# Patient Record
Sex: Male | Born: 1985 | Race: White | Hispanic: No | Marital: Married | Smoking: Current every day smoker
Health system: Southern US, Community
[De-identification: ages and names within clinical notes are randomized; demographics above are authoritative.]

## PROBLEM LIST (undated history)

## (undated) DIAGNOSIS — F909 Attention-deficit hyperactivity disorder, unspecified type: Secondary | ICD-10-CM

## (undated) DIAGNOSIS — F4310 Post-traumatic stress disorder, unspecified: Secondary | ICD-10-CM

## (undated) DIAGNOSIS — F419 Anxiety disorder, unspecified: Secondary | ICD-10-CM

## (undated) DIAGNOSIS — F319 Bipolar disorder, unspecified: Secondary | ICD-10-CM

## (undated) HISTORY — PX: WRIST SURGERY: SHX841

---
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
## 2004-07-15 ENCOUNTER — Ambulatory Visit: Payer: Self-pay | Admitting: Internal Medicine

## 2006-03-14 ENCOUNTER — Emergency Department (HOSPITAL_COMMUNITY): Admission: EM | Admit: 2006-03-14 | Discharge: 2006-03-14 | Payer: Self-pay | Admitting: Emergency Medicine

## 2006-05-10 ENCOUNTER — Emergency Department (HOSPITAL_COMMUNITY): Admission: EM | Admit: 2006-05-10 | Discharge: 2006-05-10 | Payer: Self-pay | Admitting: Family Medicine

## 2006-08-02 ENCOUNTER — Emergency Department (HOSPITAL_COMMUNITY): Admission: EM | Admit: 2006-08-02 | Discharge: 2006-08-02 | Payer: Self-pay | Admitting: Emergency Medicine

## 2008-08-11 ENCOUNTER — Emergency Department (HOSPITAL_COMMUNITY): Admission: EM | Admit: 2008-08-11 | Discharge: 2008-08-12 | Payer: Self-pay | Admitting: Emergency Medicine

## 2008-10-16 ENCOUNTER — Emergency Department: Payer: Self-pay | Admitting: Emergency Medicine

## 2008-11-05 ENCOUNTER — Emergency Department: Payer: Self-pay | Admitting: Emergency Medicine

## 2008-12-21 ENCOUNTER — Emergency Department (HOSPITAL_COMMUNITY): Admission: EM | Admit: 2008-12-21 | Discharge: 2008-12-21 | Payer: Self-pay | Admitting: Emergency Medicine

## 2009-01-24 ENCOUNTER — Encounter: Admission: RE | Admit: 2009-01-24 | Discharge: 2009-01-24 | Payer: Self-pay | Admitting: Sports Medicine

## 2009-04-24 ENCOUNTER — Emergency Department (HOSPITAL_COMMUNITY): Admission: EM | Admit: 2009-04-24 | Discharge: 2009-04-24 | Payer: Self-pay | Admitting: Emergency Medicine

## 2009-08-19 ENCOUNTER — Emergency Department: Payer: Self-pay | Admitting: Emergency Medicine

## 2009-11-26 ENCOUNTER — Emergency Department (HOSPITAL_COMMUNITY): Admission: EM | Admit: 2009-11-26 | Discharge: 2009-11-26 | Payer: Self-pay | Admitting: Emergency Medicine

## 2010-03-05 ENCOUNTER — Emergency Department (HOSPITAL_COMMUNITY)
Admission: EM | Admit: 2010-03-05 | Discharge: 2010-03-05 | Payer: Self-pay | Source: Home / Self Care | Admitting: Emergency Medicine

## 2010-05-18 ENCOUNTER — Emergency Department (HOSPITAL_COMMUNITY)
Admission: EM | Admit: 2010-05-18 | Discharge: 2010-05-18 | Disposition: A | Discharge status: Home / Self Care | Payer: Medicaid Other | Attending: Emergency Medicine | Admitting: Emergency Medicine

## 2010-05-18 DIAGNOSIS — M542 Cervicalgia: Secondary | ICD-10-CM | POA: Insufficient documentation

## 2010-05-18 DIAGNOSIS — F319 Bipolar disorder, unspecified: Secondary | ICD-10-CM | POA: Insufficient documentation

## 2010-05-18 DIAGNOSIS — I10 Essential (primary) hypertension: Secondary | ICD-10-CM | POA: Insufficient documentation

## 2010-05-18 DIAGNOSIS — X500XXA Overexertion from strenuous movement or load, initial encounter: Secondary | ICD-10-CM | POA: Insufficient documentation

## 2010-05-18 DIAGNOSIS — M545 Low back pain, unspecified: Secondary | ICD-10-CM | POA: Insufficient documentation

## 2010-05-18 DIAGNOSIS — M2560 Stiffness of unspecified joint, not elsewhere classified: Secondary | ICD-10-CM | POA: Insufficient documentation

## 2010-05-18 DIAGNOSIS — Y929 Unspecified place or not applicable: Secondary | ICD-10-CM | POA: Insufficient documentation

## 2010-05-18 DIAGNOSIS — Z79899 Other long term (current) drug therapy: Secondary | ICD-10-CM | POA: Insufficient documentation

## 2010-05-18 DIAGNOSIS — F988 Other specified behavioral and emotional disorders with onset usually occurring in childhood and adolescence: Secondary | ICD-10-CM | POA: Insufficient documentation

## 2010-05-18 DIAGNOSIS — M5380 Other specified dorsopathies, site unspecified: Secondary | ICD-10-CM | POA: Insufficient documentation

## 2010-05-18 DIAGNOSIS — M546 Pain in thoracic spine: Secondary | ICD-10-CM | POA: Insufficient documentation

## 2010-05-18 DIAGNOSIS — S335XXA Sprain of ligaments of lumbar spine, initial encounter: Secondary | ICD-10-CM | POA: Insufficient documentation

## 2010-06-10 LAB — BASIC METABOLIC PANEL
BUN: 8 mg/dL (ref 6–23)
CO2: 28 mEq/L (ref 19–32)
Calcium: 9.5 mg/dL (ref 8.4–10.5)
Chloride: 104 mEq/L (ref 96–112)
Creatinine, Ser: 0.65 mg/dL (ref 0.4–1.5)
GFR calc non Af Amer: >60 mL/min (ref >60)
Glucose, Bld: 87 mg/dL (ref 70–99)
Sodium: 140 mEq/L (ref 135–145)

## 2010-06-10 LAB — DIFFERENTIAL
Basophils Absolute: 0 10*3/uL (ref 0.0–0.1)
Lymphs Abs: 3 10*3/uL (ref 0.7–4.0)
Monocytes Relative: 9 % (ref 3–12)
Neutro Abs: 4 10*3/uL (ref 1.7–7.7)

## 2010-06-10 LAB — CBC
HCT: 44.3 % (ref 39.0–52.0)
MCHC: 35 g/dL (ref 30.0–36.0)
MCV: 87.7 fL (ref 78.0–100.0)
RBC: 5.05 MIL/uL (ref 4.22–5.81)
WBC: 7.7 10*3/uL (ref 4.0–10.5)

## 2010-06-10 LAB — URINALYSIS, ROUTINE W REFLEX MICROSCOPIC
Bilirubin Urine: NEGATIVE
Ketones, ur: NEGATIVE mg/dL
Nitrite: NEGATIVE
Protein, ur: NEGATIVE mg/dL
pH: 6.5 (ref 5.0–8.0)

## 2010-07-04 LAB — URINALYSIS, ROUTINE W REFLEX MICROSCOPIC
Glucose, UA: NEGATIVE mg/dL
Hgb urine dipstick: NEGATIVE
Nitrite: NEGATIVE
Protein, ur: NEGATIVE mg/dL
pH: 6 (ref 5.0–8.0)
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
## 2010-12-07 ENCOUNTER — Emergency Department (HOSPITAL_COMMUNITY)
Admission: EM | Admit: 2010-12-07 | Discharge: 2010-12-08 | Disposition: A | Discharge status: Home / Self Care | Payer: Medicaid Other | Attending: Emergency Medicine | Admitting: Emergency Medicine

## 2010-12-07 DIAGNOSIS — F319 Bipolar disorder, unspecified: Secondary | ICD-10-CM | POA: Insufficient documentation

## 2010-12-07 DIAGNOSIS — R51 Headache: Secondary | ICD-10-CM | POA: Insufficient documentation

## 2010-12-07 DIAGNOSIS — R6884 Jaw pain: Secondary | ICD-10-CM | POA: Insufficient documentation

## 2010-12-07 DIAGNOSIS — F988 Other specified behavioral and emotional disorders with onset usually occurring in childhood and adolescence: Secondary | ICD-10-CM | POA: Insufficient documentation

## 2010-12-07 DIAGNOSIS — M26609 Unspecified temporomandibular joint disorder, unspecified side: Secondary | ICD-10-CM | POA: Insufficient documentation

## 2010-12-16 ENCOUNTER — Emergency Department (HOSPITAL_COMMUNITY)
Admission: EM | Admit: 2010-12-16 | Discharge: 2010-12-16 | Disposition: A | Discharge status: Home / Self Care | Payer: Medicaid Other | Attending: Emergency Medicine | Admitting: Emergency Medicine

## 2010-12-16 DIAGNOSIS — T398X5A Adverse effect of other nonopioid analgesics and antipyretics, not elsewhere classified, initial encounter: Secondary | ICD-10-CM | POA: Insufficient documentation

## 2010-12-16 DIAGNOSIS — M545 Low back pain, unspecified: Secondary | ICD-10-CM | POA: Insufficient documentation

## 2010-12-16 DIAGNOSIS — R110 Nausea: Secondary | ICD-10-CM | POA: Insufficient documentation

## 2010-12-16 DIAGNOSIS — R6883 Chills (without fever): Secondary | ICD-10-CM | POA: Insufficient documentation

## 2010-12-16 DIAGNOSIS — IMO0001 Reserved for inherently not codable concepts without codable children: Secondary | ICD-10-CM | POA: Insufficient documentation

## 2010-12-16 DIAGNOSIS — R42 Dizziness and giddiness: Secondary | ICD-10-CM | POA: Insufficient documentation

## 2010-12-16 DIAGNOSIS — R5381 Other malaise: Secondary | ICD-10-CM | POA: Insufficient documentation

## 2010-12-16 DIAGNOSIS — R5383 Other fatigue: Secondary | ICD-10-CM | POA: Insufficient documentation

## 2010-12-16 LAB — ACETAMINOPHEN LEVEL: Acetaminophen (Tylenol), Serum: <15 ug/mL (ref 10–30)

## 2010-12-16 LAB — POCT I-STAT, CHEM 8
BUN: 7 mg/dL (ref 6–23)
Chloride: 102 mEq/L (ref 96–112)
Creatinine, Ser: 0.7 mg/dL (ref 0.50–1.35)
Glucose, Bld: 96 mg/dL (ref 70–99)
Hemoglobin: 16 g/dL (ref 13.0–17.0)

## 2010-12-16 LAB — ETHANOL: Alcohol, Ethyl (B): <11 mg/dL (ref 0–11)

## 2011-02-03 ENCOUNTER — Emergency Department (HOSPITAL_COMMUNITY): Payer: Medicaid Other

## 2011-02-03 ENCOUNTER — Emergency Department (HOSPITAL_COMMUNITY)
Admission: EM | Admit: 2011-02-03 | Discharge: 2011-02-03 | Disposition: A | Discharge status: Home / Self Care | Payer: Medicaid Other | Attending: Emergency Medicine | Admitting: Emergency Medicine

## 2011-02-03 ENCOUNTER — Encounter: Payer: Self-pay | Admitting: Student

## 2011-02-03 DIAGNOSIS — R05 Cough: Secondary | ICD-10-CM | POA: Insufficient documentation

## 2011-02-03 DIAGNOSIS — J40 Bronchitis, not specified as acute or chronic: Secondary | ICD-10-CM

## 2011-02-03 DIAGNOSIS — R5381 Other malaise: Secondary | ICD-10-CM | POA: Insufficient documentation

## 2011-02-03 DIAGNOSIS — R079 Chest pain, unspecified: Secondary | ICD-10-CM | POA: Insufficient documentation

## 2011-02-03 DIAGNOSIS — R059 Cough, unspecified: Secondary | ICD-10-CM | POA: Insufficient documentation

## 2011-02-03 MED ORDER — ALBUTEROL SULFATE HFA 108 (90 BASE) MCG/ACT IN AERS
2.0000 | INHALATION_SPRAY | Freq: Once | RESPIRATORY_TRACT | Status: AC
  Administered 2011-02-03: 2 via RESPIRATORY_TRACT
  Filled 2011-02-03: qty 6.7

## 2011-02-03 MED ORDER — PREDNISONE (PAK) 10 MG PO TABS: ORAL_TABLET | ORAL | Status: AC

## 2011-02-03 NOTE — ED Notes (Signed)
Pt in with c/o cough, wheezing, chest congestion, nasal congestion and pressure x 3 days. Report chest pains increase when coughing.

## 2011-02-03 NOTE — ED Provider Notes (Signed)
History     CSN: [id number] Arrival date & time: 02/03/2011  5:35 PM   First MD Initiated Contact with Patient 02/03/11 1938    Patient is a 25 y.o. male presenting with URI.  URI The primary symptoms include fatigue, sore throat, cough and wheezing. Primary symptoms do not include fever, headaches, ear pain, swollen glands, abdominal pain, nausea, vomiting, myalgias, arthralgias or rash. The current episode started 2 days ago. This is a new problem. The problem has been gradually worsening.   patient states a URI symptoms for 2 days. Reports symptoms seem to be worsening. States now is having pain while coughing in his chest. He denies trying anything over-the-counter. Denies shortness of breath, fever, abdominal pain, nausea, vomiting, headaches, neck pain, positive sick contacts  History reviewed. No pertinent past medical history.  Past Surgical History  Procedure Date  . Wrist surgery     History reviewed. No pertinent family history.  History  Substance Use Topics  . Smoking status: Current Everyday Smoker  . Smokeless tobacco: Not on file  . Alcohol Use: No      Review of Systems  Constitutional: Positive for fatigue. Negative for fever.  HENT: Positive for sore throat. Negative for ear pain, neck pain and neck stiffness.   Respiratory: Positive for cough and wheezing.   Gastrointestinal: Negative for nausea, vomiting and abdominal pain.  Musculoskeletal: Negative for myalgias and arthralgias.  Skin: Negative for rash.  Neurological: Negative for weakness, numbness and headaches.    Allergies  Review of patient's allergies indicates no known allergies.  Home Medications  No current outpatient prescriptions on file.  BP 131/72  Pulse 80  Temp 98.6 F (37 C)  Resp 20  Wt 190 lb (86.183 kg)  SpO2 100%  Physical Exam  Constitutional: He is oriented to person, place, and time. He appears well-developed and well-nourished.  HENT:  Head: Normocephalic and  atraumatic.  Right Ear: Tympanic membrane, external ear and ear canal normal.  Left Ear: Tympanic membrane, external ear and ear canal normal.  Nose: Nose normal.  Mouth/Throat: Uvula is midline, oropharynx is clear and moist and mucous membranes are normal.  Eyes: Conjunctivae are normal. Pupils are equal, round, and reactive to light.  Neck: Trachea normal, normal range of motion and full passive range of motion without pain. Neck supple. No spinous process tenderness and no muscular tenderness present. No Brudzinski's sign and no Kernig's sign noted.  Cardiovascular: Normal rate, regular rhythm and normal heart sounds.   Pulmonary/Chest: Effort normal and breath sounds normal.  Abdominal: Soft. Bowel sounds are normal.  Neurological: He is alert and oriented to person, place, and time.  Skin: Skin is warm and dry. No rash noted. No erythema. No pallor.  Psychiatric: He has a normal mood and affect. His behavior is normal.    ED Course  Procedures Dg Chest 2 View  02/03/2011  *RADIOLOGY REPORT*  Clinical Data: Cough and Shortness of breath.  CHEST - 2 VIEW  Comparison: 04/24/2009.  Findings: Peribronchial thickening may represent reactive changes or mild bronchitis.  No segmental infiltrate.  Probable prominent nipple shadow on the right.  This can be confirmed with nipple marker view.  Heart size within normal limits.  IMPRESSION: Peribronchial thickening.  Probable prominent nipple shadow.  Please see above.  Original Report Authenticated By: Fuller Canada, M.D.   Will treat patient for bronchitis with albuterol inhaler and pred pack. Advised Tylenol and ibuprofen for pain if needed. MDM  Thomasene Lot, [place] 02/03/11 2007

## 2011-02-03 NOTE — ED Provider Notes (Signed)
Medical screening examination/treatment/procedure(s) were performed by non-physician practitioner and as supervising physician I was immediately available for consultation/collaboration.  Ethelda Chick, MD 02/03/11 2008

## 2011-05-10 ENCOUNTER — Encounter (HOSPITAL_COMMUNITY): Payer: Self-pay

## 2011-05-10 ENCOUNTER — Emergency Department (HOSPITAL_COMMUNITY): Payer: Medicaid Other

## 2011-05-10 ENCOUNTER — Emergency Department (HOSPITAL_COMMUNITY)
Admission: EM | Admit: 2011-05-10 | Discharge: 2011-05-11 | Disposition: A | Discharge status: Home / Self Care | Payer: Medicaid Other | Attending: Emergency Medicine | Admitting: Emergency Medicine

## 2011-05-10 DIAGNOSIS — R10819 Abdominal tenderness, unspecified site: Secondary | ICD-10-CM | POA: Insufficient documentation

## 2011-05-10 DIAGNOSIS — M549 Dorsalgia, unspecified: Secondary | ICD-10-CM | POA: Insufficient documentation

## 2011-05-10 DIAGNOSIS — G8929 Other chronic pain: Secondary | ICD-10-CM | POA: Insufficient documentation

## 2011-05-10 DIAGNOSIS — R109 Unspecified abdominal pain: Secondary | ICD-10-CM | POA: Insufficient documentation

## 2011-05-10 LAB — CBC
HCT: 44.3 % (ref 39.0–52.0)
Hemoglobin: 15.8 g/dL (ref 13.0–17.0)
MCV: 87.5 fL (ref 78.0–100.0)
Platelets: 288 10*3/uL (ref 150–400)
RBC: 5.06 MIL/uL (ref 4.22–5.81)
WBC: 9.3 10*3/uL (ref 4.0–10.5)

## 2011-05-10 LAB — DIFFERENTIAL
Basophils Absolute: 0 10*3/uL (ref 0.0–0.1)
Basophils Relative: 0 % (ref 0–1)
Eosinophils Absolute: 0.1 10*3/uL (ref 0.0–0.7)
Eosinophils Relative: 1 % (ref 0–5)
Lymphocytes Relative: 35 % (ref 12–46)
Lymphs Abs: 3.2 10*3/uL (ref 0.7–4.0)
Neutro Abs: 5.4 10*3/uL (ref 1.7–7.7)
Neutrophils Relative %: 58 % (ref 43–77)

## 2011-05-10 LAB — COMPREHENSIVE METABOLIC PANEL
ALT: 21 U/L (ref 0–53)
AST: 27 U/L (ref 0–37)
Albumin: 4.5 g/dL (ref 3.5–5.2)
CO2: 24 mEq/L (ref 19–32)
Creatinine, Ser: 0.6 mg/dL (ref 0.50–1.35)
Potassium: 3.7 mEq/L (ref 3.5–5.1)
Sodium: 138 mEq/L (ref 135–145)
Total Protein: 8.4 g/dL — ABNORMAL HIGH (ref 6.0–8.3)

## 2011-05-10 LAB — URINALYSIS, ROUTINE W REFLEX MICROSCOPIC
Bilirubin Urine: NEGATIVE
Hgb urine dipstick: NEGATIVE
Leukocytes, UA: NEGATIVE
Nitrite: NEGATIVE

## 2011-05-10 MED ORDER — HYDROCODONE-ACETAMINOPHEN 5-325 MG PO TABS: 2.0000 | ORAL_TABLET | ORAL | Status: AC | PRN

## 2011-05-10 MED ORDER — SODIUM CHLORIDE 0.9 % IV BOLUS (SEPSIS)
1000.0000 mL | Freq: Once | INTRAVENOUS | Status: AC
  Administered 2011-05-10: 1000 mL via INTRAVENOUS

## 2011-05-10 MED ORDER — IOHEXOL 300 MG/ML  SOLN
100.0000 mL | Freq: Once | INTRAMUSCULAR | Status: AC | PRN
  Administered 2011-05-10: 100 mL via INTRAVENOUS

## 2011-05-10 MED ORDER — METOCLOPRAMIDE HCL 10 MG PO TABS: 10.0000 mg | ORAL_TABLET | Freq: Four times a day (QID) | ORAL | Status: DC | PRN

## 2011-05-10 MED ORDER — ONDANSETRON HCL 4 MG/2ML IJ SOLN
4.0000 mg | Freq: Once | INTRAMUSCULAR | Status: AC
  Administered 2011-05-10: 4 mg via INTRAVENOUS
  Filled 2011-05-10: qty 2

## 2011-05-10 MED ORDER — MORPHINE SULFATE 4 MG/ML IJ SOLN
4.0000 mg | Freq: Once | INTRAMUSCULAR | Status: AC
  Administered 2011-05-10: 4 mg via INTRAVENOUS
  Filled 2011-05-10: qty 1

## 2011-05-10 NOTE — ED Provider Notes (Signed)
History     CSN: [id number]  Arrival date & time 05/10/11  1904   First MD Initiated Contact with Patient 05/10/11 2135      Chief Complaint  Patient presents with  . Abdominal Pain     Patient is a 26 y.o. male presenting with abdominal pain.  Abdominal Pain The primary symptoms of the illness include abdominal pain and diarrhea. The primary symptoms of the illness do not include fever, shortness of breath, nausea, vomiting or dysuria.  Additional symptoms associated with the illness include back pain. Symptoms associated with the illness do not include chills, constipation, hematuria or frequency.    History provided by the patient. Patient is a 26 year old male with no significant past medical history presents with complaints of lower abdominal pains and irregular bowel movements. Patient states symptoms have been going on longer than a year. Patient has not been evaluated for these symptoms previously. Symptoms are made worse after eating and are associated with loose diarrhea-like stools after eating. Patient denies having blood or mucus in stools. Symptoms are described as moderate to severe at times. Pains are sharp and cramping. Patient also has second complaint of worsening chronic low back pains. Patient states he he has been feeling with low back pains for many many years and was formally seen by specialist. Since that time he reports increased episodes of low back soreness and pain. Pains and back are worse with moving and walking. They're sometimes improved with leaning forward. He denies any urinary or fecal incontinence, urinary retention, weakness or numbness in extremities. Patient denies any other aggravating or alleviating factors for his symptoms patient denies any fever, chills, sweats, nausea or vomiting. Patient reports having a brother with history of IBS and mother with history of multiple back surgeries.    History reviewed. No pertinent past medical history.  Past  Surgical History  Procedure Date  . Wrist surgery     History reviewed. No pertinent family history.  History  Substance Use Topics  . Smoking status: Current Everyday Smoker  . Smokeless tobacco: Not on file  . Alcohol Use: No      Review of Systems  Constitutional: Negative for fever and chills.  Respiratory: Negative for cough and shortness of breath.   Cardiovascular: Negative for chest pain.  Gastrointestinal: Positive for abdominal pain and diarrhea. Negative for nausea, vomiting, constipation and blood in stool.  Genitourinary: Negative for dysuria, frequency, hematuria and flank pain.  Musculoskeletal: Positive for back pain.  All other systems reviewed and are negative.    Allergies  Review of patient's allergies indicates no known allergies.  Home Medications  No current outpatient prescriptions on file.  BP 152/80  Pulse 67  Temp(Src) 99.1 F (37.3 C) (Oral)  Resp 18  SpO2 100%  Physical Exam  Nursing note and vitals reviewed. Constitutional: He appears well-developed and well-nourished.  HENT:  Head: Normocephalic.  Cardiovascular: Normal rate and regular rhythm.   Pulmonary/Chest: Effort normal and breath sounds normal.  Abdominal: Soft. There is tenderness in the right lower quadrant, suprapubic area and left lower quadrant. There is no rigidity, no rebound, no guarding, no CVA tenderness, no tenderness at McBurney's point and negative Murphy's sign.  Musculoskeletal:       Cervical back: Normal.       Thoracic back: He exhibits tenderness.       Lumbar back: He exhibits tenderness.       Back:  Neurological: He has normal strength. No cranial nerve deficit  or sensory deficit. Gait normal.    ED Course  Procedures    Labs Reviewed  URINALYSIS, ROUTINE W REFLEX MICROSCOPIC  CBC  DIFFERENTIAL  COMPREHENSIVE METABOLIC PANEL   Results for orders placed during the hospital encounter of 05/10/11  URINALYSIS, ROUTINE W REFLEX MICROSCOPIC       Component Value Range   Color, Urine YELLOW  YELLOW    APPearance CLEAR  CLEAR    Specific Gravity, Urine 1.014  1.005 - 1.030    pH 7.0  5.0 - 8.0    Glucose, UA NEGATIVE  NEGATIVE (mg/dL)   Hgb urine dipstick NEGATIVE  NEGATIVE    Bilirubin Urine NEGATIVE  NEGATIVE    Ketones, ur NEGATIVE  NEGATIVE (mg/dL)   Protein, ur NEGATIVE  NEGATIVE (mg/dL)   Urobilinogen, UA 1.0  0.0 - 1.0 (mg/dL)   Nitrite NEGATIVE  NEGATIVE    Leukocytes, UA NEGATIVE  NEGATIVE   CBC      Component Value Range   WBC 9.3  4.0 - 10.5 (K/uL)   RBC 5.06  4.22 - 5.81 (MIL/uL)   Hemoglobin 15.8  13.0 - 17.0 (g/dL)   HCT 16.1  09.6 - 04.5 (%)   MCV 87.5  78.0 - 100.0 (fL)   MCH 31.2  26.0 - 34.0 (pg)   MCHC 35.7  30.0 - 36.0 (g/dL)   RDW 40.9  81.1 - 91.4 (%)   Platelets 288  150 - 400 (K/uL)  DIFFERENTIAL      Component Value Range   Neutrophils Relative 58  43 - 77 (%)   Neutro Abs 5.4  1.7 - 7.7 (K/uL)   Lymphocytes Relative 35  12 - 46 (%)   Lymphs Abs 3.2  0.7 - 4.0 (K/uL)   Monocytes Relative 6  3 - 12 (%)   Monocytes Absolute 0.6  0.1 - 1.0 (K/uL)   Eosinophils Relative 1  0 - 5 (%)   Eosinophils Absolute 0.1  0.0 - 0.7 (K/uL)   Basophils Relative 0  0 - 1 (%)   Basophils Absolute 0.0  0.0 - 0.1 (K/uL)  COMPREHENSIVE METABOLIC PANEL      Component Value Range   Sodium 138  135 - 145 (mEq/L)   Potassium 3.7  3.5 - 5.1 (mEq/L)   Chloride 102  96 - 112 (mEq/L)   CO2 24  19 - 32 (mEq/L)   Glucose, Bld 89  70 - 99 (mg/dL)   BUN 11  6 - 23 (mg/dL)   Creatinine, Ser 7.82  0.50 - 1.35 (mg/dL)   Calcium 95.6  8.4 - 10.5 (mg/dL)   Total Protein 8.4 (*) 6.0 - 8.3 (g/dL)   Albumin 4.5  3.5 - 5.2 (g/dL)   AST 27  0 - 37 (U/L)   ALT 21  0 - 53 (U/L)   Alkaline Phosphatase 71  39 - 117 (U/L)   Total Bilirubin 0.3  0.3 - 1.2 (mg/dL)   GFR calc non Af Amer >90  >90 (mL/min)   GFR calc Af Amer >90  >90 (mL/min)      Ct Abdomen Pelvis W Contrast  05/10/2011  *RADIOLOGY REPORT*  Clinical Data: Right  lower quadrant pain and nausea  CT ABDOMEN AND PELVIS WITH CONTRAST  Technique:  Multidetector CT imaging of the abdomen and pelvis was performed following the standard protocol during bolus administration of intravenous contrast.  Contrast: OMNIPAQUE IOHEXOL 300 MG/ML IV SOLN  Comparison: None.  Findings: The lung bases are clear.  The  liver, spleen, gallbladder, pancreas, adrenal glands, kidneys, abdominal aorta, and retroperitoneal lymph nodes are unremarkable.  The stomach is filled with contrast and fluid without wall thickening.  Proximal jejunum demonstrates mild dilatation with contrast filled loops. Distal small bowel is decompressed.  The transition zone is in the upper mid abdomen.  No focal mass lesions are demonstrated.  This may represent early partial obstruction.  Stool filled colon without distension.  No free air or free fluid in the abdomen.  Pelvis:  The bladder wall is not thickened.  Prostate gland is not enlarged.  No free or loculated pelvic fluid collections.  No inflammatory changes in the sigmoid colon.  The appendix is not visualized but no inflammatory changes are demonstrated in the right lower quadrant.  Normal alignment of the lumbar vertebrae.  IMPRESSION: Borderline dilatation of proximal jejunum with transition zone in the upper mid abdomen and more decompressed distal loops.  Changes could represent early partial obstruction.  No obstructing mass is visualized and changes may represent adhesions.  No focal inflammatory process demonstrated.  Original Report Authenticated By: Marlon Pel, M.D.     1. Abdominal pain   2. Chronic back pain       MDM  9:30 PM patient seen and evaluated. In no acute distress. Symptoms are chronic but worsening.  Patient having some improvement of pain symptoms. Lab tests are unremarkable today. CAT scan also without any signs for emergent condition. There is some signs for possible slowing of bowels the patient has been  without any symptoms of nausea and vomiting. Patient also has no history of surgery on abdomen. This time patient fell able to return home.      Angus Seller, PA 05/11/11 0002

## 2011-05-10 NOTE — ED Notes (Signed)
Pt complains of lower abd pain for one month that acutely happens everytime he eats, he also complains of lower back pain

## 2011-05-11 NOTE — ED Provider Notes (Signed)
Medical screening examination/treatment/procedure(s) were performed by non-physician practitioner and as supervising physician I was immediately available for consultation/collaboration.    Nelia Shi, MD 05/11/11 2248

## 2011-06-17 ENCOUNTER — Emergency Department (HOSPITAL_COMMUNITY): Payer: Medicaid Other

## 2011-06-17 ENCOUNTER — Emergency Department (HOSPITAL_COMMUNITY)
Admission: EM | Admit: 2011-06-17 | Discharge: 2011-06-17 | Disposition: A | Discharge status: Home / Self Care | Payer: Medicaid Other | Attending: Emergency Medicine | Admitting: Emergency Medicine

## 2011-06-17 DIAGNOSIS — M791 Myalgia, unspecified site: Secondary | ICD-10-CM

## 2011-06-17 DIAGNOSIS — IMO0001 Reserved for inherently not codable concepts without codable children: Secondary | ICD-10-CM | POA: Insufficient documentation

## 2011-06-17 DIAGNOSIS — F17200 Nicotine dependence, unspecified, uncomplicated: Secondary | ICD-10-CM | POA: Insufficient documentation

## 2011-06-17 MED ORDER — IBUPROFEN 800 MG PO TABS
800.0000 mg | ORAL_TABLET | Freq: Once | ORAL | Status: AC
  Administered 2011-06-17: 800 mg via ORAL
  Filled 2011-06-17: qty 1

## 2011-06-17 NOTE — ED Provider Notes (Signed)
Medical screening examination/treatment/procedure(s) were performed by non-physician practitioner and as supervising physician I was immediately available for consultation/collaboration.  Cheri Guppy, MD 06/17/11 [phone]

## 2011-06-17 NOTE — Discharge Instructions (Signed)
Jesus Bauer you have muscle pain today.  Try using ice and heat intermittantly.  Take ibuprofen 600mg  every 6 hours x 24 with food.  Get a pcp from the list to follow up with or return if fever, uncontrolled nausea and vomiting.    Cryotherapy Cryotherapy means treatment with cold. Ice or gel packs can be used to reduce both pain and swelling. Ice is the most helpful within the first 24 to 48 hours after an injury or flareup from overusing a muscle or joint. Sprains, strains, spasms, burning pain, shooting pain, and aches can all be eased with ice. Ice can also be used when recovering from surgery. Ice is effective, has very few side effects, and is safe for most people to use. PRECAUTIONS  Ice is not a safe treatment option for people with:  Raynaud's phenomenon. This is a condition affecting small blood vessels in the extremities. Exposure to cold may cause your problems to return.   Cold hypersensitivity. There are many forms of cold hypersensitivity, including:   Cold urticaria. Red, itchy hives appear on the skin when the tissues begin to warm after being iced.   Cold erythema. This is a red, itchy rash caused by exposure to cold.   Cold hemoglobinuria. Red blood cells break down when the tissues begin to warm after being iced. The hemoglobin that carry oxygen are passed into the urine because they cannot combine with blood proteins fast enough.   Numbness or altered sensitivity in the area being iced.  If you have any of the following conditions, do not use ice until you have discussed cryotherapy with your caregiver:  Heart conditions, such as arrhythmia, angina, or chronic heart disease.   High blood pressure.   Healing wounds or open skin in the area being iced.   Current infections.   Rheumatoid arthritis.   Poor circulation.   Diabetes.  Ice slows the blood flow in the region it is applied. This is beneficial when trying to stop inflamed tissues from spreading irritating  chemicals to surrounding tissues. However, if you expose your skin to cold temperatures for too long or without the proper protection, you can damage your skin or nerves. Watch for signs of skin damage due to cold. HOME CARE INSTRUCTIONS Follow these tips to use ice and cold packs safely.  Place a dry or damp towel between the ice and skin. A damp towel will cool the skin more quickly, so you may need to shorten the time that the ice is used.   For a more rapid response, add gentle compression to the ice.   Ice for no more than 10 to 20 minutes at a time. The bonier the area you are icing, the less time it will take to get the benefits of ice.   Check your skin after 5 minutes to make sure there are no signs of a poor response to cold or skin damage.   Rest 20 minutes or more in between uses.   Once your skin is numb, you can end your treatment. You can test numbness by very lightly touching your skin. The touch should be so light that you do not see the skin dimple from the pressure of your fingertip. When using ice, most people will feel these normal sensations in this order: cold, burning, aching, and numbness.   Do not use ice on someone who cannot communicate their responses to pain, such as small children or people with dementia.  HOW TO MAKE AN  ICE PACK Ice packs are the most common way to use ice therapy. Other methods include ice massage, ice baths, and cryo-sprays. Muscle creams that cause a cold, tingly feeling do not offer the same benefits that ice offers and should not be used as a substitute unless recommended by your caregiver. To make an ice pack, do one of the following:  Place crushed ice or a bag of frozen vegetables in a sealable plastic bag. Squeeze out the excess air. Place this bag inside another plastic bag. Slide the bag into a pillowcase or place a damp towel between your skin and the bag.   Mix 3 parts water with 1 part rubbing alcohol. Freeze the mixture in a  sealable plastic bag. When you remove the mixture from the freezer, it will be slushy. Squeeze out the excess air. Place this bag inside another plastic bag. Slide the bag into a pillowcase or place a damp towel between your skin and the bag.  SEEK MEDICAL CARE IF:  You develop white spots on your skin. This may give the skin a blotchy (mottled) appearance.   Your skin turns blue or pale.   Your skin becomes waxy or hard.   Your swelling gets worse.  MAKE SURE YOU:   Understand these instructions.   Will watch your condition.   Will get help right away if you are not doing well or get worse.  Document Released: 11/10/2010 Document Revised: 03/05/2011 Document Reviewed: 11/10/2010 [facility] Patient Information 2012 [place], [place].Cryotherapy Cryotherapy means treatment with cold. Ice or gel packs can be used to reduce both pain and swelling. Ice is the most helpful within the first 24 to 48 hours after an injury or flareup from overusing a muscle or joint. Sprains, strains, spasms, burning pain, shooting pain, and aches can all be eased with ice. Ice can also be used when recovering from surgery. Ice is effective, has very few side effects, and is safe for most people to use. PRECAUTIONS  Ice is not a safe treatment option for people with:  Raynaud's phenomenon. This is a condition affecting small blood vessels in the extremities. Exposure to cold may cause your problems to return.   Cold hypersensitivity. There are many forms of cold hypersensitivity, including:   Cold urticaria. Red, itchy hives appear on the skin when the tissues begin to warm after being iced.   Cold erythema. This is a red, itchy rash caused by exposure to cold.   Cold hemoglobinuria. Red blood cells break down when the tissues begin to warm after being iced. The hemoglobin that carry oxygen are passed into the urine because they cannot combine with blood proteins fast enough.   Numbness or altered sensitivity in  the area being iced.  If you have any of the following conditions, do not use ice until you have discussed cryotherapy with your caregiver:  Heart conditions, such as arrhythmia, angina, or chronic heart disease.   High blood pressure.   Healing wounds or open skin in the area being iced.   Current infections.   Rheumatoid arthritis.   Poor circulation.   Diabetes.  Ice slows the blood flow in the region it is applied. This is beneficial when trying to stop inflamed tissues from spreading irritating chemicals to surrounding tissues. However, if you expose your skin to cold temperatures for too long or without the proper protection, you can damage your skin or nerves. Watch for signs of skin damage due to cold. HOME CARE INSTRUCTIONS Follow these  tips to use ice and cold packs safely.  Place a dry or damp towel between the ice and skin. A damp towel will cool the skin more quickly, so you may need to shorten the time that the ice is used.   For a more rapid response, add gentle compression to the ice.   Ice for no more than 10 to 20 minutes at a time. The bonier the area you are icing, the less time it will take to get the benefits of ice.   Check your skin after 5 minutes to make sure there are no signs of a poor response to cold or skin damage.   Rest 20 minutes or more in between uses.   Once your skin is numb, you can end your treatment. You can test numbness by very lightly touching your skin. The touch should be so light that you do not see the skin dimple from the pressure of your fingertip. When using ice, most people will feel these normal sensations in this order: cold, burning, aching, and numbness.   Do not use ice on someone who cannot communicate their responses to pain, such as small children or people with dementia.  HOW TO MAKE AN ICE PACK Ice packs are the most common way to use ice therapy. Other methods include ice massage, ice baths, and cryo-sprays. Muscle  creams that cause a cold, tingly feeling do not offer the same benefits that ice offers and should not be used as a substitute unless recommended by your caregiver. To make an ice pack, do one of the following:  Place crushed ice or a bag of frozen vegetables in a sealable plastic bag. Squeeze out the excess air. Place this bag inside another plastic bag. Slide the bag into a pillowcase or place a damp towel between your skin and the bag.   Mix 3 parts water with 1 part rubbing alcohol. Freeze the mixture in a sealable plastic bag. When you remove the mixture from the freezer, it will be slushy. Squeeze out the excess air. Place this bag inside another plastic bag. Slide the bag into a pillowcase or place a damp towel between your skin and the bag.  SEEK MEDICAL CARE IF:  You develop white spots on your skin. This may give the skin a blotchy (mottled) appearance.   Your skin turns blue or pale.   Your skin becomes waxy or hard.   Your swelling gets worse.  MAKE SURE YOU:   Understand these instructions.   Will watch your condition.   Will get help right away if you are not doing well or get worse.  Document Released: 11/10/2010 Document Revised: 03/05/2011 Document Reviewed: 11/10/2010 [facility] Patient Information 2012 [place], [place].Cryotherapy Cryotherapy means treatment with cold. Ice or gel packs can be used to reduce both pain and swelling. Ice is the most helpful within the first 24 to 48 hours after an injury or flareup from overusing a muscle or joint. Sprains, strains, spasms, burning pain, shooting pain, and aches can all be eased with ice. Ice can also be used when recovering from surgery. Ice is effective, has very few side effects, and is safe for most people to use. PRECAUTIONS  Ice is not a safe treatment option for people with:  Raynaud's phenomenon. This is a condition affecting small blood vessels in the extremities. Exposure to cold may cause your problems to return.     Cold hypersensitivity. There are many forms of cold hypersensitivity, including:  Cold urticaria. Red, itchy hives appear on the skin when the tissues begin to warm after being iced.   Cold erythema. This is a red, itchy rash caused by exposure to cold.   Cold hemoglobinuria. Red blood cells break down when the tissues begin to warm after being iced. The hemoglobin that carry oxygen are passed into the urine because they cannot combine with blood proteins fast enough.   Numbness or altered sensitivity in the area being iced.  If you have any of the following conditions, do not use ice until you have discussed cryotherapy with your caregiver:  Heart conditions, such as arrhythmia, angina, or chronic heart disease.   High blood pressure.   Healing wounds or open skin in the area being iced.   Current infections.   Rheumatoid arthritis.   Poor circulation.   Diabetes.  Ice slows the blood flow in the region it is applied. This is beneficial when trying to stop inflamed tissues from spreading irritating chemicals to surrounding tissues. However, if you expose your skin to cold temperatures for too long or without the proper protection, you can damage your skin or nerves. Watch for signs of skin damage due to cold. HOME CARE INSTRUCTIONS Follow these tips to use ice and cold packs safely.  Place a dry or damp towel between the ice and skin. A damp towel will cool the skin more quickly, so you may need to shorten the time that the ice is used.   For a more rapid response, add gentle compression to the ice.   Ice for no more than 10 to 20 minutes at a time. The bonier the area you are icing, the less time it will take to get the benefits of ice.   Check your skin after 5 minutes to make sure there are no signs of a poor response to cold or skin damage.   Rest 20 minutes or more in between uses.   Once your skin is numb, you can end your treatment. You can test numbness by very  lightly touching your skin. The touch should be so light that you do not see the skin dimple from the pressure of your fingertip. When using ice, most people will feel these normal sensations in this order: cold, burning, aching, and numbness.   Do not use ice on someone who cannot communicate their responses to pain, such as small children or people with dementia.  HOW TO MAKE AN ICE PACK Ice packs are the most common way to use ice therapy. Other methods include ice massage, ice baths, and cryo-sprays. Muscle creams that cause a cold, tingly feeling do not offer the same benefits that ice offers and should not be used as a substitute unless recommended by your caregiver. To make an ice pack, do one of the following:  Place crushed ice or a bag of frozen vegetables in a sealable plastic bag. Squeeze out the excess air. Place this bag inside another plastic bag. Slide the bag into a pillowcase or place a damp towel between your skin and the bag.   Mix 3 parts water with 1 part rubbing alcohol. Freeze the mixture in a sealable plastic bag. When you remove the mixture from the freezer, it will be slushy. Squeeze out the excess air. Place this bag inside another plastic bag. Slide the bag into a pillowcase or place a damp towel between your skin and the bag.  SEEK MEDICAL CARE IF:  You develop white spots on your  skin. This may give the skin a blotchy (mottled) appearance.   Your skin turns blue or pale.   Your skin becomes waxy or hard.   Your swelling gets worse.  MAKE SURE YOU:   Understand these instructions.   Will watch your condition.   Will get help right away if you are not doing well or get worse.  Document Released: 11/10/2010 Document Revised: 03/05/2011 Document Reviewed: 11/10/2010 [facility] Patient Information 2012 [place], [place].Cryotherapy Cryotherapy means treatment with cold. Ice or gel packs can be used to reduce both pain and swelling. Ice is the most helpful within the  first 24 to 48 hours after an injury or flareup from overusing a muscle or joint. Sprains, strains, spasms, burning pain, shooting pain, and aches can all be eased with ice. Ice can also be used when recovering from surgery. Ice is effective, has very few side effects, and is safe for most people to use. PRECAUTIONS  Ice is not a safe treatment option for people with:  Raynaud's phenomenon. This is a condition affecting small blood vessels in the extremities. Exposure to cold may cause your problems to return.   Cold hypersensitivity. There are many forms of cold hypersensitivity, including:   Cold urticaria. Red, itchy hives appear on the skin when the tissues begin to warm after being iced.   Cold erythema. This is a red, itchy rash caused by exposure to cold.   Cold hemoglobinuria. Red blood cells break down when the tissues begin to warm after being iced. The hemoglobin that carry oxygen are passed into the urine because they cannot combine with blood proteins fast enough.   Numbness or altered sensitivity in the area being iced.  If you have any of the following conditions, do not use ice until you have discussed cryotherapy with your caregiver:  Heart conditions, such as arrhythmia, angina, or chronic heart disease.   High blood pressure.   Healing wounds or open skin in the area being iced.   Current infections.   Rheumatoid arthritis.   Poor circulation.   Diabetes.  Ice slows the blood flow in the region it is applied. This is beneficial when trying to stop inflamed tissues from spreading irritating chemicals to surrounding tissues. However, if you expose your skin to cold temperatures for too long or without the proper protection, you can damage your skin or nerves. Watch for signs of skin damage due to cold. HOME CARE INSTRUCTIONS Follow these tips to use ice and cold packs safely.  Place a dry or damp towel between the ice and skin. A damp towel will cool the skin more  quickly, so you may need to shorten the time that the ice is used.   For a more rapid response, add gentle compression to the ice.   Ice for no more than 10 to 20 minutes at a time. The bonier the area you are icing, the less time it will take to get the benefits of ice.   Check your skin after 5 minutes to make sure there are no signs of a poor response to cold or skin damage.   Rest 20 minutes or more in between uses.   Once your skin is numb, you can end your treatment. You can test numbness by very lightly touching your skin. The touch should be so light that you do not see the skin dimple from the pressure of your fingertip. When using ice, most people will feel these normal sensations in this order: cold,  burning, aching, and numbness.   Do not use ice on someone who cannot communicate their responses to pain, such as small children or people with dementia.  HOW TO MAKE AN ICE PACK Ice packs are the most common way to use ice therapy. Other methods include ice massage, ice baths, and cryo-sprays. Muscle creams that cause a cold, tingly feeling do not offer the same benefits that ice offers and should not be used as a substitute unless recommended by your caregiver. To make an ice pack, do one of the following:  Place crushed ice or a bag of frozen vegetables in a sealable plastic bag. Squeeze out the excess air. Place this bag inside another plastic bag. Slide the bag into a pillowcase or place a damp towel between your skin and the bag.   Mix 3 parts water with 1 part rubbing alcohol. Freeze the mixture in a sealable plastic bag. When you remove the mixture from the freezer, it will be slushy. Squeeze out the excess air. Place this bag inside another plastic bag. Slide the bag into a pillowcase or place a damp towel between your skin and the bag.  SEEK MEDICAL CARE IF:  You develop white spots on your skin. This may give the skin a blotchy (mottled) appearance.   Your skin turns blue  or pale.   Your skin becomes waxy or hard.   Your swelling gets worse.  MAKE SURE YOU:   Understand these instructions.   Will watch your condition.   Will get help right away if you are not doing well or get worse.  Document Released: 11/10/2010 Document Revised: 03/05/2011 Document Reviewed: 11/10/2010 [facility] Patient Information 2012 [place], [place].

## 2011-06-17 NOTE — ED Notes (Signed)
Patient reports that he developed left hip pain while walking, reports pain with movement, denies trauma

## 2011-06-17 NOTE — ED Provider Notes (Signed)
History     CSN: [id number]  Arrival date & time 06/17/11  1256   None     Chief Complaint  Patient presents with  . Hip Pain    (Consider location/radiation/quality/duration/timing/severity/associated sxs/prior treatment) Patient is a 26 y.o. male presenting with hip pain. The history is provided by the patient. No language interpreter was used.  Hip Pain This is a new problem. The current episode started yesterday. The problem occurs every several days. Associated symptoms include arthralgias. Pertinent negatives include no chills, fever, joint swelling, nausea, numbness, swollen glands, vomiting or weakness. The symptoms are aggravated by nothing (laying down in bed). He has tried nothing for the symptoms. The treatment provided mild relief.   Reports R Hip pain after walking 1 mile to the bus stop and back yesterday.  Pain is worse laying down. Denies testicular pain, fever, nausea or vomiting.  Came here today with his friend on the ambulance.  Pain is anterior hip with palpatation.  Ambulating without difficulty.  Feels better when he walks on his heels.  Good reflexes. Took no pain meds. Denies injury. No past medical history on file.  Past Surgical History  Procedure Date  . Wrist surgery     No family history on file.  History  Substance Use Topics  . Smoking status: Current Everyday Smoker  . Smokeless tobacco: Not on file  . Alcohol Use: No      Review of Systems  Constitutional: Negative for fever and chills.  Cardiovascular: Negative for leg swelling.  Gastrointestinal: Negative for nausea and vomiting.  Musculoskeletal: Positive for arthralgias. Negative for back pain, joint swelling and gait problem.  Neurological: Negative for weakness and numbness.  Psychiatric/Behavioral: Negative.     Allergies  Review of patient's allergies indicates no known allergies.  Home Medications  No current outpatient prescriptions on file.  BP 121/72  Pulse 65  Resp  18  SpO2 100%  Physical Exam  Nursing note and vitals reviewed. Constitutional: He is oriented to person, place, and time. He appears well-developed and well-nourished.  HENT:  Head: Normocephalic.  Eyes: Conjunctivae and EOM are normal. Pupils are equal, round, and reactive to light.  Neck: Normal range of motion. Neck supple.  Cardiovascular: Normal rate.   Pulmonary/Chest: Effort normal.  Musculoskeletal: Normal range of motion. He exhibits tenderness. He exhibits no edema.       R anterior hip tender with palpatation.  No tesitucular tenderness upon exam.  No swelling or bruising noted  Neurological: He is alert and oriented to person, place, and time.  Skin: Skin is warm and dry.  Psychiatric: He has a normal mood and affect.    ED Course  Procedures (including critical care time)  Labs Reviewed - No data to display Dg Hip Complete Left  06/17/2011  *RADIOLOGY REPORT*  Clinical Data: Marthe Patch a pop while walking, left hip pain since  LEFT HIP - COMPLETE 2+ VIEW  Comparison: None  Findings: Osseous mineralization normal. Symmetric hip and SI joints. No acute fracture, dislocation, or bone destruction. No definite regional soft tissue abnormalities.  IMPRESSION: Normal exam.  Original Report Authenticated By: Lollie Marrow, M.D.     No diagnosis found.    MDM  R anterior hip tenderness treated in ER with ice and ibuprofen.  Will get a pcp to follow up with or return if worse.         Jethro Bastos, NP 06/17/11 [id number]

## 2011-06-17 NOTE — ED Notes (Signed)
Per ems: pt is having hip pain. Pt was walking and started having hip pain. Pt is stable and in the lobby at this time

## 2011-06-17 NOTE — ED Notes (Signed)
Pt verbalized understanding of todays admission, medication, pain management and follow up care.

## 2011-07-07 ENCOUNTER — Emergency Department (HOSPITAL_COMMUNITY)
Admission: EM | Admit: 2011-07-07 | Discharge: 2011-07-07 | Disposition: A | Discharge status: Home / Self Care | Payer: Medicaid Other | Attending: Emergency Medicine | Admitting: Emergency Medicine

## 2011-07-07 ENCOUNTER — Encounter (HOSPITAL_COMMUNITY): Payer: Self-pay

## 2011-07-07 DIAGNOSIS — R197 Diarrhea, unspecified: Secondary | ICD-10-CM | POA: Insufficient documentation

## 2011-07-07 DIAGNOSIS — M549 Dorsalgia, unspecified: Secondary | ICD-10-CM | POA: Insufficient documentation

## 2011-07-07 DIAGNOSIS — F909 Attention-deficit hyperactivity disorder, unspecified type: Secondary | ICD-10-CM | POA: Insufficient documentation

## 2011-07-07 DIAGNOSIS — F319 Bipolar disorder, unspecified: Secondary | ICD-10-CM | POA: Insufficient documentation

## 2011-07-07 DIAGNOSIS — R109 Unspecified abdominal pain: Secondary | ICD-10-CM | POA: Insufficient documentation

## 2011-07-07 HISTORY — DX: Bipolar disorder, unspecified: F31.9

## 2011-07-07 HISTORY — DX: Attention-deficit hyperactivity disorder, unspecified type: F90.9

## 2011-07-07 LAB — DIFFERENTIAL
Basophils Absolute: 0 10*3/uL (ref 0.0–0.1)
Eosinophils Relative: 1 % (ref 0–5)
Lymphocytes Relative: 29 % (ref 12–46)
Monocytes Absolute: 0.6 10*3/uL (ref 0.1–1.0)
Monocytes Relative: 7 % (ref 3–12)
Neutro Abs: 5.5 10*3/uL (ref 1.7–7.7)

## 2011-07-07 LAB — CBC
HCT: 46.1 % (ref 39.0–52.0)
Hemoglobin: 16.3 g/dL (ref 13.0–17.0)
MCHC: 35.4 g/dL (ref 30.0–36.0)
MCV: 88.3 fL (ref 78.0–100.0)
RDW: 12 % (ref 11.5–15.5)
WBC: 8.6 10*3/uL (ref 4.0–10.5)

## 2011-07-07 LAB — COMPREHENSIVE METABOLIC PANEL
BUN: 10 mg/dL (ref 6–23)
CO2: 26 mEq/L (ref 19–32)
Calcium: 10.1 mg/dL (ref 8.4–10.5)
Chloride: 102 mEq/L (ref 96–112)
Creatinine, Ser: 0.69 mg/dL (ref 0.50–1.35)
GFR calc Af Amer: >90 mL/min (ref >90)
GFR calc non Af Amer: >90 mL/min (ref >90)
Total Bilirubin: 0.3 mg/dL (ref 0.3–1.2)

## 2011-07-07 LAB — URINALYSIS, ROUTINE W REFLEX MICROSCOPIC
Nitrite: NEGATIVE
Protein, ur: NEGATIVE mg/dL
Specific Gravity, Urine: 1.024 (ref 1.005–1.030)
Urobilinogen, UA: 0.2 mg/dL (ref 0.0–1.0)

## 2011-07-07 LAB — URINE MICROSCOPIC-ADD ON

## 2011-07-07 LAB — LIPASE, BLOOD: Lipase: 30 U/L (ref 11–59)

## 2011-07-07 MED ORDER — DICYCLOMINE HCL 20 MG PO TABS: 20.0000 mg | ORAL_TABLET | Freq: Two times a day (BID) | ORAL | Status: DC

## 2011-07-07 MED ORDER — HYDROMORPHONE HCL PF 1 MG/ML IJ SOLN
1.0000 mg | Freq: Once | INTRAMUSCULAR | Status: AC
  Administered 2011-07-07: 1 mg via INTRAVENOUS
  Filled 2011-07-07: qty 1

## 2011-07-07 MED ORDER — SODIUM CHLORIDE 0.9 % IV SOLN
1000.0000 mL | INTRAVENOUS | Status: DC
  Administered 2011-07-07: 1000 mL via INTRAVENOUS

## 2011-07-07 MED ORDER — SODIUM CHLORIDE 0.9 % IV SOLN
1000.0000 mL | Freq: Once | INTRAVENOUS | Status: AC
  Administered 2011-07-07: 1000 mL via INTRAVENOUS

## 2011-07-07 MED ORDER — ONDANSETRON HCL 4 MG/2ML IJ SOLN
4.0000 mg | Freq: Once | INTRAMUSCULAR | Status: AC
  Administered 2011-07-07: 4 mg via INTRAVENOUS
  Filled 2011-07-07: qty 2

## 2011-07-07 MED ORDER — ETODOLAC 500 MG PO TABS: 500.0000 mg | ORAL_TABLET | Freq: Two times a day (BID) | ORAL | Status: DC

## 2011-07-07 MED ORDER — KETOROLAC TROMETHAMINE 30 MG/ML IJ SOLN
30.0000 mg | Freq: Once | INTRAMUSCULAR | Status: AC
  Administered 2011-07-07: 30 mg via INTRAVENOUS
  Filled 2011-07-07: qty 1

## 2011-07-07 MED ORDER — IBUPROFEN 600 MG PO TABS: 600.0000 mg | ORAL_TABLET | Freq: Four times a day (QID) | ORAL | Status: DC | PRN

## 2011-07-07 NOTE — ED Provider Notes (Signed)
History     CSN: [id number]  Arrival date & time 07/07/11  1607   First MD Initiated Contact with Patient 07/07/11 2013      Chief Complaint  Patient presents with  . Abdominal Pain  . Back Pain    (Consider location/radiation/quality/duration/timing/severity/associated sxs/prior treatment) HPI Pt has been having trouble with abdominal pain for years.  He states there is a history of it in his family.  He is not sure what the cause is though despite seeing several doctors.  This episode started 2 days ago.  The pain is in the lower abdomen and to the back.  No vomiting but he does have diarrhea.  NO sore throat.  No cough.  No fevers, or dysuria.  Nothing makes it worse or better including food. Past Medical History  Diagnosis Date  . Bipolar 1 disorder   . ADHD (attention deficit hyperactivity disorder)     Past Surgical History  Procedure Date  . Wrist surgery     No family history on file.  History  Substance Use Topics  . Smoking status: Current Everyday Smoker  . Smokeless tobacco: Not on file  . Alcohol Use: No      Review of Systems  All other systems reviewed and are negative.    Allergies  Review of patient's allergies indicates no known allergies.  Home Medications  No current outpatient prescriptions on file.  BP 123/63  Pulse 71  Temp(Src) 98.7 F (37.1 C) (Oral)  Resp 18  SpO2 100%  Physical Exam  Nursing note and vitals reviewed. Constitutional: He appears well-developed and well-nourished. No distress.  HENT:  Head: Normocephalic and atraumatic.  Right Ear: External ear normal.  Left Ear: External ear normal.  Eyes: Conjunctivae are normal. Right eye exhibits no discharge. Left eye exhibits no discharge. No scleral icterus.  Neck: Neck supple. No tracheal deviation present.  Cardiovascular: Normal rate, regular rhythm and intact distal pulses.   Pulmonary/Chest: Effort normal and breath sounds normal. No stridor. No respiratory  distress. He has no wheezes. He has no rales.  Abdominal: Soft. Bowel sounds are normal. He exhibits no distension. There is tenderness (mild in rlq). There is no rebound and no guarding.  Musculoskeletal: He exhibits no edema and no tenderness.  Neurological: He is alert. He has normal strength. No sensory deficit. Cranial nerve deficit:  no gross defecits noted. He exhibits normal muscle tone. He displays no seizure activity. Coordination normal.  Skin: Skin is warm and dry. No rash noted.  Psychiatric: He has a normal mood and affect.    ED Course  Procedures (including critical care time)  Labs Reviewed  COMPREHENSIVE METABOLIC PANEL - Abnormal; Notable for the following:    Total Protein 8.5 (*)    All other components within normal limits  URINALYSIS, ROUTINE W REFLEX MICROSCOPIC - Abnormal; Notable for the following:    APPearance CLOUDY (*)    Leukocytes, UA TRACE (*)    All other components within normal limits  CBC  DIFFERENTIAL  LIPASE, BLOOD  URINE MICROSCOPIC-ADD ON   No results found.   1. Abdominal pain       MDM  The patient has had recurrent episodes of abdominal pain. He is not certain as to the etiology of these episodes. At this time, his dominant exam is benign. His laboratory evaluation is unremarkable. I doubt acute emergency medical condition such as appendicitis, colitis, ureteral stone, or bowel obstruction. The patient has been encouraged to follow up with  primary doctor for further evaluation.        Celene Kras, MD 07/07/11 2213

## 2011-07-07 NOTE — Discharge Instructions (Signed)

## 2011-07-07 NOTE — ED Notes (Signed)
Pt in from home withc/o abd pain and lower back pain denies recent injury states a hx of chronic abd and back states diarrhea and dizziness

## 2011-07-17 ENCOUNTER — Encounter (HOSPITAL_COMMUNITY): Payer: Self-pay | Admitting: Adult Health

## 2011-07-17 ENCOUNTER — Emergency Department (HOSPITAL_COMMUNITY)
Admission: EM | Admit: 2011-07-17 | Discharge: 2011-07-17 | Disposition: A | Discharge status: Home / Self Care | Payer: Medicaid Other | Attending: Emergency Medicine | Admitting: Emergency Medicine

## 2011-07-17 DIAGNOSIS — R109 Unspecified abdominal pain: Secondary | ICD-10-CM | POA: Insufficient documentation

## 2011-07-17 DIAGNOSIS — G8929 Other chronic pain: Secondary | ICD-10-CM | POA: Insufficient documentation

## 2011-07-17 LAB — CBC
HCT: 44 % (ref 39.0–52.0)
Hemoglobin: 15.5 g/dL (ref 13.0–17.0)
MCHC: 35.2 g/dL (ref 30.0–36.0)
MCV: 87.6 fL (ref 78.0–100.0)

## 2011-07-17 LAB — COMPREHENSIVE METABOLIC PANEL
Alkaline Phosphatase: 70 U/L (ref 39–117)
BUN: 11 mg/dL (ref 6–23)
Chloride: 102 mEq/L (ref 96–112)
Creatinine, Ser: 0.62 mg/dL (ref 0.50–1.35)
GFR calc Af Amer: >90 mL/min (ref >90)
Glucose, Bld: 86 mg/dL (ref 70–99)
Potassium: 3.6 mEq/L (ref 3.5–5.1)
Total Bilirubin: 0.4 mg/dL (ref 0.3–1.2)
Total Protein: 7.7 g/dL (ref 6.0–8.3)

## 2011-07-17 LAB — URINALYSIS, ROUTINE W REFLEX MICROSCOPIC
Glucose, UA: NEGATIVE mg/dL
Hgb urine dipstick: NEGATIVE
Ketones, ur: NEGATIVE mg/dL
Leukocytes, UA: NEGATIVE
pH: 7 (ref 5.0–8.0)

## 2011-07-17 LAB — LIPASE, BLOOD: Lipase: 42 U/L (ref 11–59)

## 2011-07-17 MED ORDER — HYDROCODONE-ACETAMINOPHEN 5-325 MG PO TABS: 1.0000 | ORAL_TABLET | ORAL | Status: AC | PRN

## 2011-07-17 MED ORDER — OXYCODONE-ACETAMINOPHEN 5-325 MG PO TABS
1.0000 | ORAL_TABLET | Freq: Once | ORAL | Status: AC
  Administered 2011-07-17: 1 via ORAL
  Filled 2011-07-17: qty 1

## 2011-07-17 NOTE — ED Notes (Signed)
C/o lower right sided abdominal pain that radiates to groin. States he was seen here 2 week sago and told he has gastritis. The pain has not gotten any better and is described as sharp.

## 2011-07-17 NOTE — Discharge Instructions (Signed)
As we discussed, your labs today did not give any indication of what is causing your pain, but it does not appear to be from an emergency problem. Please follow-up with the GI doctor as we discussed.      Abdominal Pain Abdominal pain can be caused by many things. Your caregiver decides the seriousness of your pain by an examination and possibly blood tests and X-rays. Many cases can be observed and treated at home. Most abdominal pain is not caused by a disease and will probably improve without treatment. However, in many cases, more time must pass before a clear cause of the pain can be found. Before that point, it may not be known if you need more testing, or if hospitalization or surgery is needed. HOME CARE INSTRUCTIONS   Do not take laxatives unless directed by your caregiver.   Take pain medicine only as directed by your caregiver.   Only take over-the-counter or prescription medicines for pain, discomfort, or fever as directed by your caregiver.   Try a clear liquid diet (broth, tea, or water) for as long as directed by your caregiver. Slowly move to a bland diet as tolerated.  SEEK IMMEDIATE MEDICAL CARE IF:   The pain does not go away.   You have a fever.   You keep throwing up (vomiting).   The pain is felt only in portions of the abdomen. Pain in the right side could possibly be appendicitis. In an adult, pain in the left lower portion of the abdomen could be colitis or diverticulitis.   You pass bloody or black tarry stools.  MAKE SURE YOU:   Understand these instructions.   Will watch your condition.   Will get help right away if you are not doing well or get worse.  Document Released: 12/24/2004 Document Revised: 03/05/2011 Document Reviewed: 11/02/2007 [facility] Patient Information 2012 [place], [place].

## 2011-07-17 NOTE — ED Provider Notes (Signed)
Medical screening examination/treatment/procedure(s) were performed by non-physician practitioner and as supervising physician I was immediately available for consultation/collaboration.  Flint Melter, MD 07/17/11 1730

## 2011-07-17 NOTE — ED Provider Notes (Signed)
History     CSN: [id number]  Arrival date & time 07/17/11  1113   First MD Initiated Contact with Patient 07/17/11 1210      Chief Complaint  Patient presents with  . Abdominal Pain    (Consider location/radiation/quality/duration/timing/severity/associated sxs/prior treatment) The history is provided by the patient and medical records.   patient is a 19 neural male with a past medical history of self-reported chronic abdominal pain intermittently for at least the last several years who presents to the emergency department with a chief complaint of right lower abdominal pain for about the last 2 weeks. The pain radiates to the right lower back and occasionally to the right groin. Sharp, moderate in severity. He endorses chronic diarrhea, described as non-watery to loose stools that are nonbloody; he denies any nausea or vomiting. Denies any fever, chills, cough, dysuria, hematuria, penile discharge, testicular swelling or pain. Palpation of the area makes the pain worse, nothing makes it better. There is no change with eating. He denies any prior abdominal surgeries. He reports multiple prior evaluations for same by his primary care physician with no specific diagnosis. He was seen in the emergency department for this complaint 10 days ago with no acute findings and was discharged home. Past records indicate pt was also seen for this complaint in the ED 2 months ago with a negative workup. He apparently has a family history of IBS with no personal dx of same.  Past Medical History  Diagnosis Date  . Bipolar 1 disorder   . ADHD (attention deficit hyperactivity disorder)     Past Surgical History  Procedure Date  . Wrist surgery     History reviewed. No pertinent family history.  History  Substance Use Topics  . Smoking status: Current Everyday Smoker  . Smokeless tobacco: Not on file  . Alcohol Use: No      Review of Systems  Gastrointestinal:       See HPI, otherwise negaive   Musculoskeletal: Positive for back pain (chronic and unchanged).       Otherwise negative  All other systems reviewed and are negative.    Allergies  Review of patient's allergies indicates no known allergies.  Home Medications   Current Outpatient Rx  Name Route Sig Dispense Refill  . DICYCLOMINE HCL 20 MG PO TABS Oral Take 1 tablet (20 mg total) by mouth 2 (two) times daily. 20 tablet 0  . ETODOLAC 500 MG PO TABS Oral Take 1 tablet (500 mg total) by mouth 2 (two) times daily. 20 tablet 0    BP 150/70  Pulse 82  Temp(Src) 98.2 F (36.8 C) (Oral)  Resp 16  SpO2 100%  Physical Exam  Nursing note and vitals reviewed. Constitutional: He is oriented to person, place, and time. He appears well-developed and well-nourished. No distress.  HENT:  Head: Normocephalic and atraumatic.  Right Ear: External ear normal.  Left Ear: External ear normal.  Nose: Nose normal.  Mouth/Throat: Oropharynx is clear and moist.  Eyes: Pupils are equal, round, and reactive to light. Right eye exhibits no discharge. Left eye exhibits no discharge.  Neck: Normal range of motion. Neck supple.  Cardiovascular: Normal rate, regular rhythm and normal heart sounds.   Pulmonary/Chest: Effort normal. No respiratory distress. He has no wheezes.  Abdominal: Soft. Bowel sounds are normal. He exhibits no distension. There is no rebound and no guarding.       Mild tenderness in the epigastric region, RLQ, and LLQ. Moderate TTP in  suprapubic region.  No rigidity.  Genitourinary: Right testis shows no mass, no swelling and no tenderness. Left testis shows no mass, no swelling and no tenderness. Circumcised. No penile tenderness. No discharge found.  Musculoskeletal: Normal range of motion. He exhibits no edema.       Mild TTP in the lower thoracic and upper lumbar regions with paravertebral TTP, no bony tenderness. No deformity or stepoff. BLE strength 5/5 and symmetric in major muscle groups.  Neurological: He is  alert and oriented to person, place, and time. No cranial nerve deficit.       GCS 15. Normal gait.  Skin: Skin is warm and dry.  Psychiatric: He has a normal mood and affect.    ED Course  Procedures (including critical care time)  Labs Reviewed  COMPREHENSIVE METABOLIC PANEL - Abnormal; Notable for the following:    AST 38 (*)    All other components within normal limits  URINALYSIS, ROUTINE W REFLEX MICROSCOPIC  CBC  LIPASE, BLOOD   No results found.   Dx 1: Chronic abd pain   MDM  Chronic abd pain in otherwise medically health young male. Abd exam with multiple areas of mild tenderness, no rigiditiy, good bowel sounds. No fever, leukocytosis. Labs unremarkable. No prior abd surgeries and no N/V to suggest obstruction. Given the prolonged nature of unchanged symptoms, I doubt any emergency medical condition at this time. Patient has been advised to f/u with GI, voices understanding of plan. Return precautions discussed.        Shaaron Adler, PA-C 07/17/11 1412  Shaaron Adler, PA-C 07/17/11 1415

## 2011-11-24 ENCOUNTER — Emergency Department (HOSPITAL_COMMUNITY)
Admission: EM | Admit: 2011-11-24 | Discharge: 2011-11-24 | Disposition: A | Discharge status: Home / Self Care | Payer: Medicaid Other | Attending: Emergency Medicine | Admitting: Emergency Medicine

## 2011-11-24 ENCOUNTER — Encounter (HOSPITAL_COMMUNITY): Payer: Self-pay | Admitting: *Deleted

## 2011-11-24 DIAGNOSIS — F909 Attention-deficit hyperactivity disorder, unspecified type: Secondary | ICD-10-CM | POA: Insufficient documentation

## 2011-11-24 DIAGNOSIS — F319 Bipolar disorder, unspecified: Secondary | ICD-10-CM | POA: Insufficient documentation

## 2011-11-24 DIAGNOSIS — J029 Acute pharyngitis, unspecified: Secondary | ICD-10-CM

## 2011-11-24 DIAGNOSIS — F17200 Nicotine dependence, unspecified, uncomplicated: Secondary | ICD-10-CM | POA: Insufficient documentation

## 2011-11-24 MED ORDER — LIDOCAINE VISCOUS 2 % MT SOLN
20.0000 mL | Freq: Once | OROMUCOSAL | Status: AC
  Administered 2011-11-24: 15 mL via OROMUCOSAL
  Filled 2011-11-24: qty 15

## 2011-11-24 MED ORDER — DEXAMETHASONE SODIUM PHOSPHATE 10 MG/ML IJ SOLN
10.0000 mg | Freq: Once | INTRAMUSCULAR | Status: AC
  Administered 2011-11-24: 10 mg via INTRAMUSCULAR
  Filled 2011-11-24: qty 1

## 2011-11-24 MED ORDER — DIPHENHYDRAMINE HCL 12.5 MG/5ML PO ELIX
25.0000 mg | ORAL_SOLUTION | Freq: Once | ORAL | Status: AC
  Administered 2011-11-24: 25 mg via ORAL
  Filled 2011-11-24: qty 10

## 2011-11-24 MED ORDER — HYDROCODONE-ACETAMINOPHEN 7.5-500 MG/15ML PO SOLN: ORAL | Status: AC

## 2011-11-24 NOTE — ED Provider Notes (Signed)
History     CSN: [id number]  Arrival date & time 11/24/11  0411   First MD Initiated Contact with Patient 11/24/11 0422      Chief Complaint  Patient presents with  . sorethroat/ pain on right side throat    HPI  History provided by the patient. Patient is a 26 year old male who presents with complaints of worsening sore throat. Patient reports that symptoms began 2 days ago with sore throat. Pain is greatest on the right side and has been worsening. Pain is also worse with swallowing or eating. Patient has taken some over-the-counter medicines without significant improvement. Patient denies any recent travel. He denies any recent known sick contacts. Patient does have a remote history of strep throat infections in the past. He denies any difficulty breathing. Denies any fever, chills, sweats, cough, rhinorrhea or congestion. He denies any nausea vomiting symptoms.   Past Medical History  Diagnosis Date  . Bipolar 1 disorder   . ADHD (attention deficit hyperactivity disorder)     Past Surgical History  Procedure Date  . Wrist surgery     No family history on file.  History  Substance Use Topics  . Smoking status: Current Everyday Smoker  . Smokeless tobacco: Not on file  . Alcohol Use: No      Review of Systems  Constitutional: Negative for fever, chills and appetite change.  HENT: Positive for sore throat. Negative for congestion and rhinorrhea.   Respiratory: Negative for cough.   Gastrointestinal: Negative for nausea and vomiting.    Allergies  Review of patient's allergies indicates no known allergies.  Home Medications  No current outpatient prescriptions on file.  BP 142/78  Pulse 85  Temp 99.3 F (37.4 C) (Oral)  Resp 18  SpO2 98%  Physical Exam  Nursing note and vitals reviewed. Constitutional: He is oriented to person, place, and time. He appears well-developed and well-nourished. No distress.  HENT:  Head: Normocephalic.       Pharynx and  tonsils erythematous. Tonsils enlarged, right greater than left. Uvula midline.  Neck: Normal range of motion. Neck supple. No tracheal deviation present.       No meningeal signs  Cardiovascular: Normal rate and regular rhythm.   Pulmonary/Chest: Effort normal and breath sounds normal. No stridor.  Abdominal: Soft. There is no hepatosplenomegaly. There is no tenderness.  Lymphadenopathy:    He has no cervical adenopathy.  Neurological: He is alert and oriented to person, place, and time.  Skin: Skin is warm. No rash noted.  Psychiatric: He has a normal mood and affect. His behavior is normal.    ED Course  Procedures   Results for orders placed during the hospital encounter of 11/24/11  RAPID STREP SCREEN      Component Value Range   Streptococcus, Group A Screen (Direct) NEGATIVE  NEGATIVE      1. Pharyngitis       MDM  4:40AM patient seen and evaluated. Patient in no acute distress. Patient with enlarged tonsils right greater than left. Uvula is midline.  No signs for PTA at this time.    Negative strep test.  At this time suspect viral infection. Patient given prescription for Lortab solution for pain. Patient given strict return precautions.        Angus Seller, [place] 11/24/11 [phone]

## 2011-11-24 NOTE — ED Notes (Signed)
Pt is here with sore throat and pain on right side of throat with swallowing.  No respiratory issues

## 2011-11-24 NOTE — ED Notes (Signed)
C/o sore throat. Swelling, redness & exudate noted. (denies: fever, nv, dizziness HA or other sx), admits to "diarrhea all the time" which he "r/t IBS" (not dx'd with or h/o IBS), sore throat onset 2d ago. No h/o same, "not recurrent". "able to keep down food and fluids, abd to stay hydrated".

## 2011-11-24 NOTE — ED Notes (Signed)
Attempted IV x1 (unsuccessful), EDPA in to see pt.

## 2011-11-25 NOTE — ED Provider Notes (Signed)
Medical screening examination/treatment/procedure(s) were conducted as a shared visit with non-physician practitioner(s) and myself.  I personally evaluated the patient during the encounter   Pt with erythematous swelling of the bil tonsils, no asymetry, no exudate, normal voice quality.  No fever, no tachycardia, pt with neg strep test, stable for d/c.  Vida Roller, MD 11/25/11 [phone]

## 2012-01-01 ENCOUNTER — Emergency Department (HOSPITAL_COMMUNITY): Payer: Medicaid Other

## 2012-01-01 ENCOUNTER — Emergency Department (HOSPITAL_COMMUNITY)
Admission: EM | Admit: 2012-01-01 | Discharge: 2012-01-01 | Disposition: A | Discharge status: Home / Self Care | Payer: Medicaid Other | Attending: Emergency Medicine | Admitting: Emergency Medicine

## 2012-01-01 ENCOUNTER — Encounter (HOSPITAL_COMMUNITY): Payer: Self-pay

## 2012-01-01 DIAGNOSIS — K529 Noninfective gastroenteritis and colitis, unspecified: Secondary | ICD-10-CM

## 2012-01-01 DIAGNOSIS — F319 Bipolar disorder, unspecified: Secondary | ICD-10-CM | POA: Insufficient documentation

## 2012-01-01 DIAGNOSIS — R197 Diarrhea, unspecified: Secondary | ICD-10-CM | POA: Insufficient documentation

## 2012-01-01 DIAGNOSIS — R109 Unspecified abdominal pain: Secondary | ICD-10-CM | POA: Insufficient documentation

## 2012-01-01 DIAGNOSIS — R110 Nausea: Secondary | ICD-10-CM | POA: Insufficient documentation

## 2012-01-01 DIAGNOSIS — F909 Attention-deficit hyperactivity disorder, unspecified type: Secondary | ICD-10-CM | POA: Insufficient documentation

## 2012-01-01 DIAGNOSIS — K5289 Other specified noninfective gastroenteritis and colitis: Secondary | ICD-10-CM | POA: Insufficient documentation

## 2012-01-01 HISTORY — DX: Post-traumatic stress disorder, unspecified: F43.10

## 2012-01-01 LAB — CBC WITH DIFFERENTIAL/PLATELET
Basophils Absolute: 0 10*3/uL (ref 0.0–0.1)
Eosinophils Relative: 2 % (ref 0–5)
Lymphocytes Relative: 44 % (ref 12–46)
Lymphs Abs: 2.9 10*3/uL (ref 0.7–4.0)
MCV: 86.2 fL (ref 78.0–100.0)
Neutro Abs: 3 10*3/uL (ref 1.7–7.7)
Platelets: 228 10*3/uL (ref 150–400)
RBC: 4.13 MIL/uL — ABNORMAL LOW (ref 4.22–5.81)
WBC: 6.5 10*3/uL (ref 4.0–10.5)

## 2012-01-01 LAB — URINALYSIS, ROUTINE W REFLEX MICROSCOPIC
Bilirubin Urine: NEGATIVE
Hgb urine dipstick: NEGATIVE
Nitrite: NEGATIVE
Specific Gravity, Urine: 1.021 (ref 1.005–1.030)
pH: 7.5 (ref 5.0–8.0)

## 2012-01-01 LAB — COMPREHENSIVE METABOLIC PANEL
ALT: 14 U/L (ref 0–53)
Albumin: 3.8 g/dL (ref 3.5–5.2)
Alkaline Phosphatase: 61 U/L (ref 39–117)
Potassium: 4.4 mEq/L (ref 3.5–5.1)
Sodium: 135 mEq/L (ref 135–145)
Total Protein: 7.1 g/dL (ref 6.0–8.3)

## 2012-01-01 LAB — URINE MICROSCOPIC-ADD ON

## 2012-01-01 LAB — LIPASE, BLOOD: Lipase: 44 U/L (ref 11–59)

## 2012-01-01 MED ORDER — IOHEXOL 300 MG/ML  SOLN
100.0000 mL | Freq: Once | INTRAMUSCULAR | Status: AC | PRN
  Administered 2012-01-01: 100 mL via INTRAVENOUS

## 2012-01-01 MED ORDER — ONDANSETRON HCL 4 MG/2ML IJ SOLN
4.0000 mg | Freq: Once | INTRAMUSCULAR | Status: AC
  Administered 2012-01-01: 4 mg via INTRAVENOUS
  Filled 2012-01-01: qty 2

## 2012-01-01 MED ORDER — AZITHROMYCIN 1 G PO PACK
1.0000 g | PACK | Freq: Once | ORAL | Status: AC
  Administered 2012-01-01: 1 g via ORAL
  Filled 2012-01-01: qty 1

## 2012-01-01 MED ORDER — SODIUM CHLORIDE 0.9 % IV SOLN: 1000.0000 mL | INTRAVENOUS | Status: DC

## 2012-01-01 MED ORDER — SODIUM CHLORIDE 0.9 % IV SOLN
1000.0000 mL | Freq: Once | INTRAVENOUS | Status: AC
  Administered 2012-01-01: 1000 mL via INTRAVENOUS

## 2012-01-01 MED ORDER — MORPHINE SULFATE 4 MG/ML IJ SOLN
4.0000 mg | Freq: Once | INTRAMUSCULAR | Status: AC
  Administered 2012-01-01: 4 mg via INTRAVENOUS
  Filled 2012-01-01: qty 1

## 2012-01-01 MED ORDER — CEFTRIAXONE SODIUM 250 MG IJ SOLR
250.0000 mg | Freq: Once | INTRAMUSCULAR | Status: AC
  Administered 2012-01-01: 250 mg via INTRAMUSCULAR
  Filled 2012-01-01: qty 250

## 2012-01-01 NOTE — ED Notes (Signed)
IV team at bedside attempting IV insertion and lab draw, this nurse not successful, will monitor.

## 2012-01-01 NOTE — ED Notes (Signed)
CT called and made aware that pt finished oral contrast.

## 2012-01-01 NOTE — ED Provider Notes (Addendum)
History     CSN: [id number]  Arrival date & time 01/01/12  1641   First MD Initiated Contact with Patient 01/01/12 1923      Chief Complaint  Patient presents with  . Abdominal Pain  . Nausea  .      dark stool and STD testing    (Consider location/radiation/quality/duration/timing/severity/associated sxs/prior treatment) The history is provided by the patient.  Jesus Bauer is a 26 y.o. male here with ab pain, nausea and diarrhea. He went to steak and shake 4 days ago. Since then, he has been having nausea whenever he eats but able to tolerate PO. No fevers. Also he has lower abdominal pain and diarrhea. No fevers. No penile discharge or pain but his girlfriend is currently being treated for chlamydia.    Past Medical History  Diagnosis Date  . Bipolar 1 disorder   . ADHD (attention deficit hyperactivity disorder)   . PTSD (post-traumatic stress disorder)     Past Surgical History  Procedure Date  . Wrist surgery     No family history on file.  History  Substance Use Topics  . Smoking status: Current Some Day Smoker  . Smokeless tobacco: Not on file  . Alcohol Use: No      Review of Systems  Gastrointestinal: Positive for nausea, abdominal pain and diarrhea.  All other systems reviewed and are negative.    Allergies  Review of patient's allergies indicates no known allergies.  Home Medications  No current outpatient prescriptions on file.  BP 124/63  Pulse 63  Temp 98.7 F (37.1 C) (Oral)  Resp 14  Wt 190 lb (86.183 kg)  SpO2 100%  Physical Exam  Nursing note and vitals reviewed. Constitutional: He is oriented to person, place, and time. He appears well-developed and well-nourished. No distress.  HENT:  Head: Normocephalic.  Mouth/Throat: Oropharynx is clear and moist.  Eyes: Conjunctivae normal are normal. Pupils are equal, round, and reactive to light.  Neck: Normal range of motion. Neck supple.  Cardiovascular: Normal rate, regular  rhythm and normal heart sounds.   Pulmonary/Chest: Effort normal and breath sounds normal. No respiratory distress. He has no wheezes. He has no rales.  Abdominal: Soft. Bowel sounds are normal. He exhibits no distension.       + periumbilical tenderness and RLQ tenderness, no rebound.   Genitourinary:       Refused penile exam.   Musculoskeletal: Normal range of motion. He exhibits no edema and no tenderness.  Neurological: He is alert and oriented to person, place, and time.  Skin: Skin is warm and dry.  Psychiatric: He has a normal mood and affect. His behavior is normal. Judgment and thought content normal.    ED Course  Procedures (including critical care time)  Labs Reviewed  URINALYSIS, ROUTINE W REFLEX MICROSCOPIC - Abnormal; Notable for the following:    APPearance TURBID (*)     All other components within normal limits  CBC WITH DIFFERENTIAL - Abnormal; Notable for the following:    RBC 4.13 (*)     Hemoglobin 12.4 (*)     HCT 35.6 (*)     All other components within normal limits  COMPREHENSIVE METABOLIC PANEL  LIPASE, BLOOD  URINE MICROSCOPIC-ADD ON  CBC WITH DIFFERENTIAL   Ct Abdomen Pelvis W Contrast  01/01/2012  *RADIOLOGY REPORT*  Clinical Data: Nausea and diarrhea.  CT ABDOMEN AND PELVIS WITH CONTRAST  Technique:  Multidetector CT imaging of the abdomen and pelvis was performed following  the standard protocol during bolus administration of intravenous contrast.  Contrast: OMNIPAQUE IOHEXOL 300 MG/ML  SOLN  Comparison: 05/10/2011  Findings: The liver, spleen, pancreas, and adrenal glands appear unremarkable.  The gallbladder and biliary system appear unremarkable.  The kidneys appear unremarkable, as do the proximal ureters.  No pathologic retroperitoneal or porta hepatis adenopathy is identified.  No pathologic pelvic adenopathy is identified.  The appendix appears unremarkable.  There is mild wall thickening in the ascending colon on image 46 of series 3,  potentially representing low-level colitis.  No small bowel dilatation observed.  Urinary bladder unremarkable.  IMPRESSION:  1.  Subtle wall thickening in the ascending colon is suspicious for mild colitis. 2.  The appendix appears normal.   Original Report Authenticated By: Dellia Cloud, M.D.      1. Colitis   2. Diarrhea       MDM  Jesus Bauer is a 26 y.o. male here with nausea, RLQ pain. He likely has gastroenteritis vs appendicitis. He also will need treatment for chlamydia. He refused penile swab so will treat him empirically. Will also get labs, CT ab/pel, give IVF and reassess.   10:31 PM Patient treated for gc/chlamydia. Labs nl, UA nl. CT ab/pel showed mild colitis. He has no recent travel or fever so will hold off on abx. Will d/c home with follow up. He was able to tolerate PO in the ED.       Richardean Canal, MD 01/01/12 2231  Richardean Canal, MD 01/01/12 2232

## 2012-01-01 NOTE — ED Notes (Signed)
Pt been having nausea, diarrhea since Oct 1st after steak and shake. Not able to keep food down since steak and shake, if he eat he get abdominal pain. Pt also reports dark stool, and requesting for STD testing because his girlfriend was tested positive for chlamydia.

## 2012-05-18 IMAGING — CR DG HIP (WITH OR WITHOUT PELVIS) 2-3V*L*
3 series · 3 of 3 positions shown · non-contrast
Comparison: None

CLINICAL DATA: Heard a pop while walking, left hip pain since

LEFT HIP - COMPLETE 2+ VIEW

[t pelvis ap]
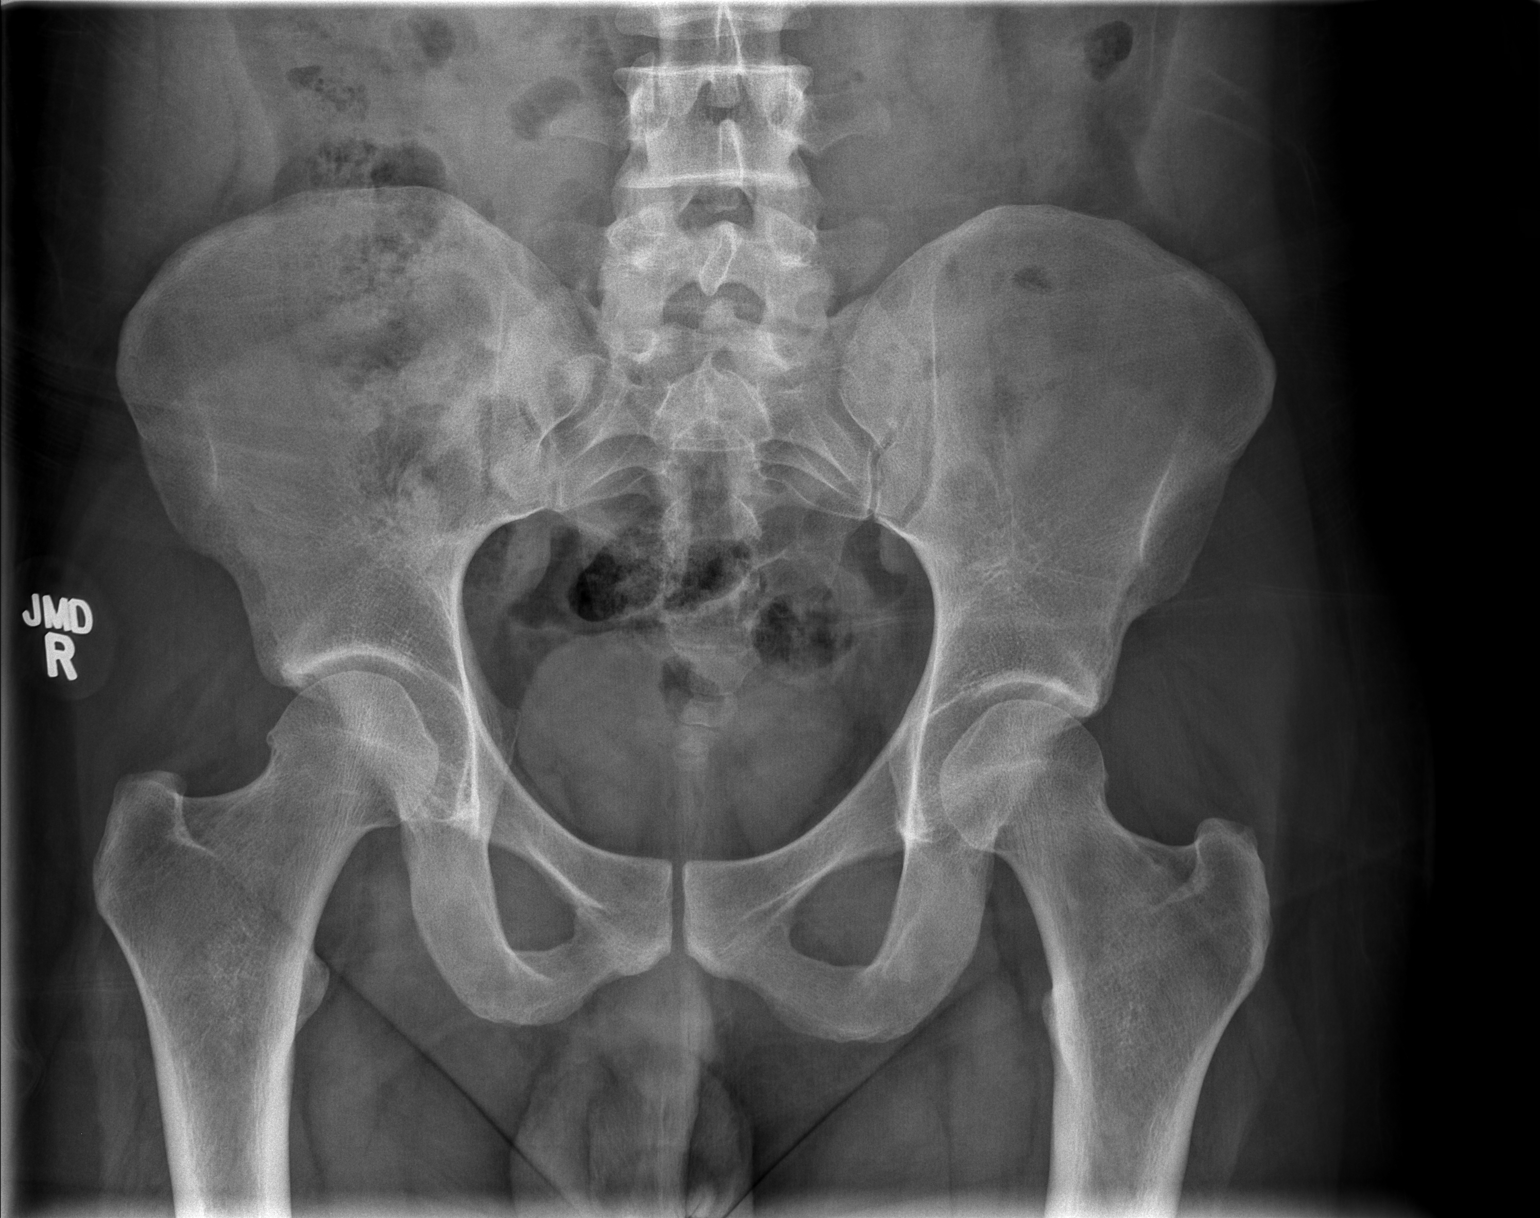

[t hip ap left]
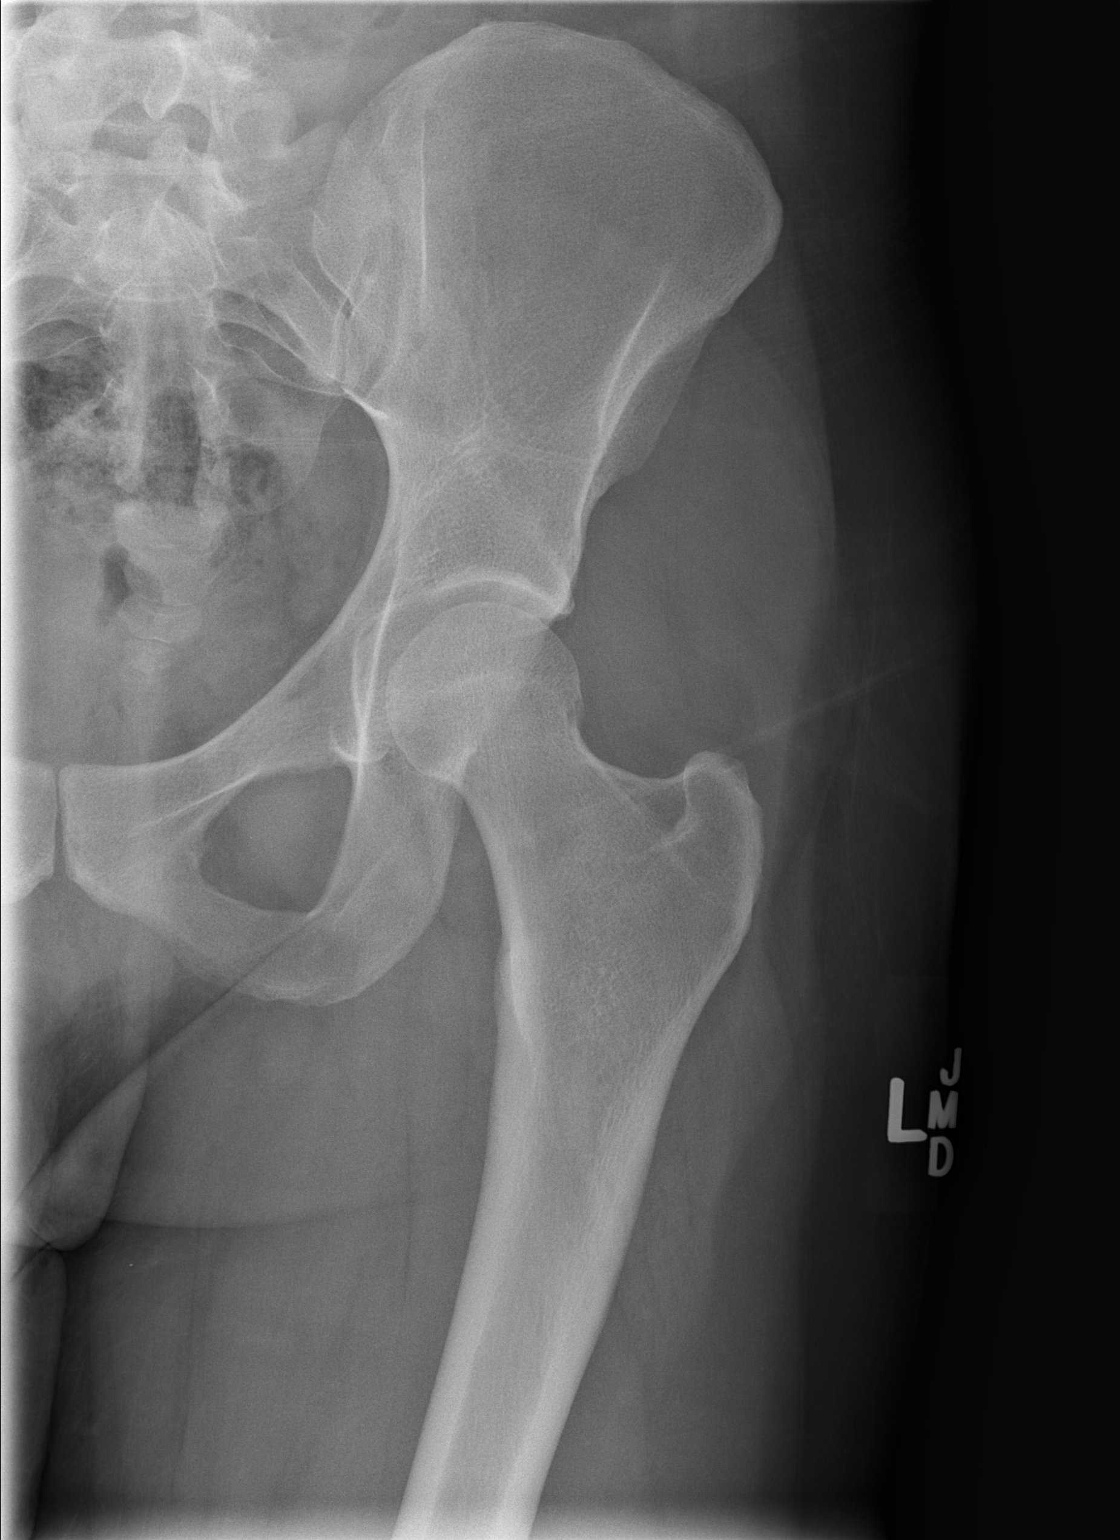

[t hip frog leg left]
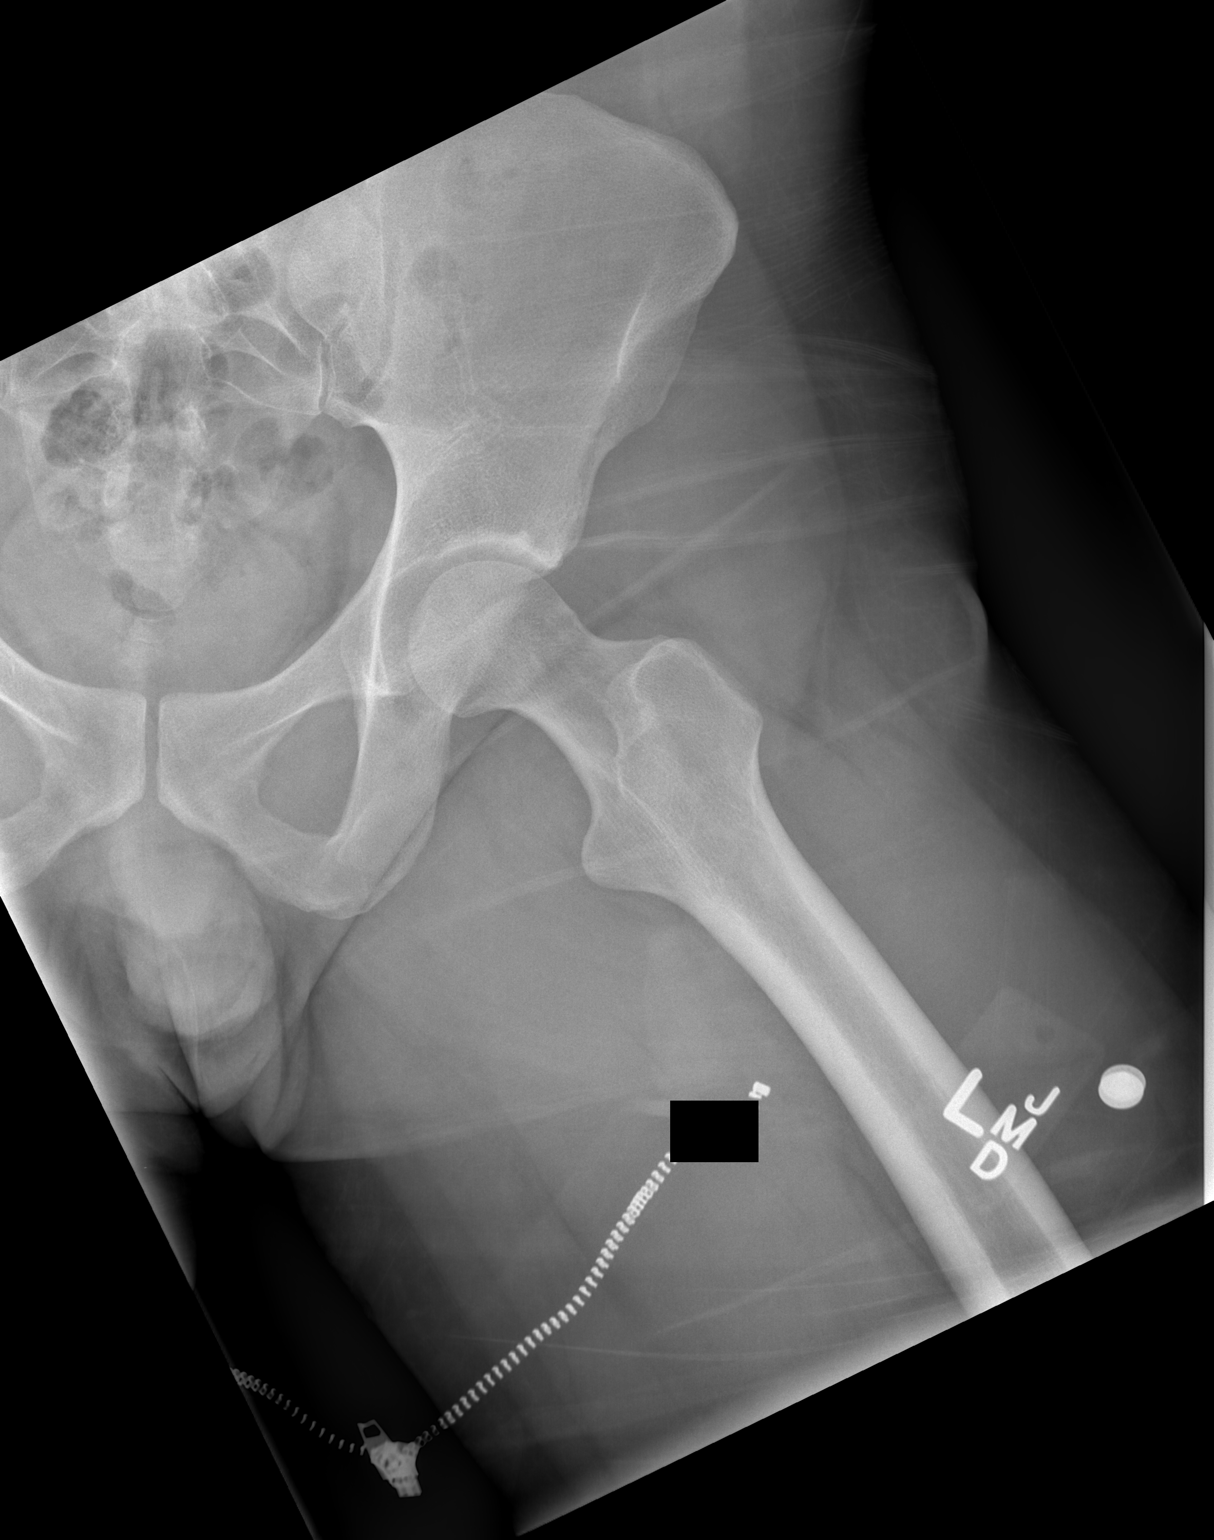

[3 of 3 positions shown; findings below may reference images not displayed]

FINDINGS: Osseous mineralization normal.
Symmetric hip and SI joints.
No acute fracture, dislocation, or bone destruction.
No definite regional soft tissue abnormalities.
IMPRESSION: Normal exam.

## 2013-05-07 ENCOUNTER — Emergency Department (HOSPITAL_COMMUNITY)
Admission: EM | Admit: 2013-05-07 | Discharge: 2013-05-08 | Disposition: A | Discharge status: Home / Self Care | Payer: Medicaid Other | Attending: Emergency Medicine | Admitting: Emergency Medicine

## 2013-05-07 ENCOUNTER — Encounter (HOSPITAL_COMMUNITY): Payer: Self-pay | Admitting: Emergency Medicine

## 2013-05-07 DIAGNOSIS — R109 Unspecified abdominal pain: Secondary | ICD-10-CM | POA: Insufficient documentation

## 2013-05-07 DIAGNOSIS — Y939 Activity, unspecified: Secondary | ICD-10-CM | POA: Insufficient documentation

## 2013-05-07 DIAGNOSIS — R197 Diarrhea, unspecified: Secondary | ICD-10-CM | POA: Insufficient documentation

## 2013-05-07 DIAGNOSIS — W268XXA Contact with other sharp object(s), not elsewhere classified, initial encounter: Secondary | ICD-10-CM | POA: Insufficient documentation

## 2013-05-07 DIAGNOSIS — Z8659 Personal history of other mental and behavioral disorders: Secondary | ICD-10-CM | POA: Insufficient documentation

## 2013-05-07 DIAGNOSIS — Y929 Unspecified place or not applicable: Secondary | ICD-10-CM | POA: Insufficient documentation

## 2013-05-07 DIAGNOSIS — S60459A Superficial foreign body of unspecified finger, initial encounter: Secondary | ICD-10-CM | POA: Insufficient documentation

## 2013-05-07 DIAGNOSIS — G8929 Other chronic pain: Secondary | ICD-10-CM | POA: Insufficient documentation

## 2013-05-07 DIAGNOSIS — F17200 Nicotine dependence, unspecified, uncomplicated: Secondary | ICD-10-CM | POA: Insufficient documentation

## 2013-05-07 DIAGNOSIS — T148XXA Other injury of unspecified body region, initial encounter: Secondary | ICD-10-CM

## 2013-05-07 LAB — COMPREHENSIVE METABOLIC PANEL
ALBUMIN: 4.2 g/dL (ref 3.5–5.2)
ALK PHOS: 64 U/L (ref 39–117)
ALT: 56 U/L — AB (ref 0–53)
AST: 41 U/L — AB (ref 0–37)
BUN: 12 mg/dL (ref 6–23)
CO2: 23 mEq/L (ref 19–32)
Calcium: 9.6 mg/dL (ref 8.4–10.5)
Chloride: 104 mEq/L (ref 96–112)
Creatinine, Ser: 0.59 mg/dL (ref 0.50–1.35)
GFR calc Af Amer: >90 mL/min (ref >90)
GFR calc non Af Amer: >90 mL/min (ref >90)
Glucose, Bld: 102 mg/dL — ABNORMAL HIGH (ref 70–99)
POTASSIUM: 3.6 meq/L — AB (ref 3.7–5.3)
SODIUM: 144 meq/L (ref 137–147)
TOTAL PROTEIN: 8.2 g/dL (ref 6.0–8.3)
Total Bilirubin: 0.2 mg/dL — ABNORMAL LOW (ref 0.3–1.2)

## 2013-05-07 LAB — CBC WITH DIFFERENTIAL/PLATELET
BASOS ABS: 0 10*3/uL (ref 0.0–0.1)
BASOS PCT: 0 % (ref 0–1)
EOS ABS: 0.1 10*3/uL (ref 0.0–0.7)
Eosinophils Relative: 1 % (ref 0–5)
HCT: 43.8 % (ref 39.0–52.0)
Hemoglobin: 16 g/dL (ref 13.0–17.0)
Lymphocytes Relative: 44 % (ref 12–46)
Lymphs Abs: 3.5 10*3/uL (ref 0.7–4.0)
MCH: 31.6 pg (ref 26.0–34.0)
MCHC: 36.5 g/dL — AB (ref 30.0–36.0)
MCV: 86.4 fL (ref 78.0–100.0)
Monocytes Absolute: 0.6 10*3/uL (ref 0.1–1.0)
Monocytes Relative: 7 % (ref 3–12)
NEUTROS PCT: 47 % (ref 43–77)
Neutro Abs: 3.7 10*3/uL (ref 1.7–7.7)
PLATELETS: 306 10*3/uL (ref 150–400)
RBC: 5.07 MIL/uL (ref 4.22–5.81)
RDW: 12.4 % (ref 11.5–15.5)
WBC: 7.8 10*3/uL (ref 4.0–10.5)

## 2013-05-07 LAB — URINALYSIS, ROUTINE W REFLEX MICROSCOPIC
BILIRUBIN URINE: NEGATIVE
Glucose, UA: NEGATIVE mg/dL
Ketones, ur: NEGATIVE mg/dL
Leukocytes, UA: NEGATIVE
NITRITE: NEGATIVE
PH: 5.5 (ref 5.0–8.0)
Protein, ur: NEGATIVE mg/dL
SPECIFIC GRAVITY, URINE: 1.027 (ref 1.005–1.030)
Urobilinogen, UA: 0.2 mg/dL (ref 0.0–1.0)

## 2013-05-07 LAB — URINE MICROSCOPIC-ADD ON

## 2013-05-07 LAB — LIPASE, BLOOD: LIPASE: 35 U/L (ref 11–59)

## 2013-05-07 MED ORDER — OXYCODONE-ACETAMINOPHEN 5-325 MG PO TABS
1.0000 | ORAL_TABLET | Freq: Once | ORAL | Status: AC
  Administered 2013-05-07: 1 via ORAL
  Filled 2013-05-07: qty 1

## 2013-05-07 MED ORDER — DICYCLOMINE HCL 10 MG/ML IM SOLN
20.0000 mg | Freq: Once | INTRAMUSCULAR | Status: AC
  Administered 2013-05-07: 20 mg via INTRAMUSCULAR
  Filled 2013-05-07: qty 2

## 2013-05-07 NOTE — ED Notes (Addendum)
Pt. Reports chronic abdominal pain. States has pain in lower abdomin x5 days now. Pt. Also reports diarrhea. Reports pain is worse after eating. Pt. Denies difficulty/pain with urination, penile discharge, N/V. Seen for same previously

## 2013-05-07 NOTE — ED Notes (Signed)
Per [facility]: Pt coming in reporting increasing abdominal pain x5 days. Pt reports his has Hx of chronic abdominal pain and diarrhea, but states has been worse. Pt also reports deep splinter in R middle finger.

## 2013-05-07 NOTE — ED Provider Notes (Signed)
CSN: [id number]     Arrival date & time 05/07/13  2046 History   First MD Initiated Contact with Patient 05/07/13 2305     Chief Complaint  Patient presents with  . Abdominal Pain  . Foreign Body in Skin   (Consider location/radiation/quality/duration/timing/severity/associated sxs/prior Treatment) Patient is a 28 y.o. male presenting with abdominal pain. The history is provided by the patient. No language interpreter was used.  Abdominal Pain Pain location:  Suprapubic Pain quality: cramping   Pain radiates to:  Does not radiate Pain severity:  Moderate Onset quality:  Gradual Duration: 9 years worse x 5 days. Timing:  Constant Progression:  Worsening Chronicity:  Chronic Context: not alcohol use   Relieved by:  Nothing Worsened by:  Nothing tried Ineffective treatments:  None tried Associated symptoms: diarrhea   Associated symptoms: no anorexia, no fever, no nausea, no shortness of breath and no vomiting   Risk factors: no alcohol abuse   Also has a wood splinter in the right middle finger.  Thinks he wants it removed.    Past Medical History  Diagnosis Date  . Bipolar 1 disorder   . ADHD (attention deficit hyperactivity disorder)   . PTSD (post-traumatic stress disorder)    Past Surgical History  Procedure Laterality Date  . Wrist surgery     No family history on file. History  Substance Use Topics  . Smoking status: Current Some Day Smoker  . Smokeless tobacco: Not on file  . Alcohol Use: No    Review of Systems  Constitutional: Negative for fever.  Respiratory: Negative for shortness of breath.   Gastrointestinal: Positive for abdominal pain and diarrhea. Negative for nausea, vomiting and anorexia.  All other systems reviewed and are negative.    Allergies  Review of patient's allergies indicates no known allergies.  Home Medications  No current outpatient prescriptions on file. BP 143/68  Pulse 82  Temp(Src) 98.3 F (36.8 C) (Oral)  Resp 23  Ht  5\' 6"  (1.676 m)  Wt 206 lb (93.441 kg)  BMI 33.27 kg/m2  SpO2 97% Physical Exam  Constitutional: He is oriented to person, place, and time. He appears well-developed and well-nourished. No distress.  HENT:  Head: Normocephalic and atraumatic.  Mouth/Throat: Oropharynx is clear and moist.  Eyes: Conjunctivae are normal. Pupils are equal, round, and reactive to light.  Neck: Normal range of motion. Neck supple.  Cardiovascular: Normal rate, regular rhythm and intact distal pulses.   Pulmonary/Chest: Effort normal and breath sounds normal. He has no wheezes. He has no rales.  Abdominal: Soft. Bowel sounds are normal. There is no tenderness. There is no rebound and no guarding.  Musculoskeletal: Normal range of motion.  Neurological: He is alert and oriented to person, place, and time.  Skin: Skin is warm and dry.  Psychiatric: He has a normal mood and affect.    ED Course  Procedures (including critical care time) Labs Review Labs Reviewed  CBC WITH DIFFERENTIAL - Abnormal; Notable for the following:    MCHC 36.5 (*)    All other components within normal limits  COMPREHENSIVE METABOLIC PANEL - Abnormal; Notable for the following:    Potassium 3.6 (*)    Glucose, Bld 102 (*)    AST 41 (*)    ALT 56 (*)    Total Bilirubin 0.2 (*)    All other components within normal limits  URINALYSIS, ROUTINE W REFLEX MICROSCOPIC - Abnormal; Notable for the following:    Hgb urine dipstick TRACE (*)  All other components within normal limits  URINE MICROSCOPIC-ADD ON - Abnormal; Notable for the following:    Squamous Epithelial / LPF FEW (*)    Bacteria, UA FEW (*)    All other components within normal limits  LIPASE, BLOOD   Imaging Review No results found.  EKG Interpretation   None       MDM  No diagnosis found. Also has a splinter in the right middle finger.  Now refusing removal.  Return for any concerns.  Will refer to GI.  EXam and labs and vitals are benign.    Will d/c  with GI follow up  Efton Thomley Smitty Cords, MD 05/08/13 [phone]

## 2013-05-07 NOTE — ED Notes (Signed)
Dr. Palumbo at bedside. 

## 2013-05-08 NOTE — ED Notes (Signed)
Pt. Reports pain had subsided prior to drinking fluids. Pt. States as soon as he had fluid pain came back up. Pt. Sitting comfortably in bed in no apparent distress.

## 2013-09-06 DIAGNOSIS — IMO0002 Reserved for concepts with insufficient information to code with codable children: Secondary | ICD-10-CM | POA: Insufficient documentation

## 2013-09-06 DIAGNOSIS — J069 Acute upper respiratory infection, unspecified: Secondary | ICD-10-CM | POA: Insufficient documentation

## 2013-09-06 DIAGNOSIS — F17200 Nicotine dependence, unspecified, uncomplicated: Secondary | ICD-10-CM | POA: Insufficient documentation

## 2013-09-06 DIAGNOSIS — Z792 Long term (current) use of antibiotics: Secondary | ICD-10-CM | POA: Insufficient documentation

## 2013-09-06 DIAGNOSIS — J189 Pneumonia, unspecified organism: Secondary | ICD-10-CM | POA: Insufficient documentation

## 2013-09-06 DIAGNOSIS — Z8659 Personal history of other mental and behavioral disorders: Secondary | ICD-10-CM | POA: Insufficient documentation

## 2013-09-07 ENCOUNTER — Encounter (HOSPITAL_COMMUNITY): Payer: Self-pay | Admitting: Emergency Medicine

## 2013-09-07 ENCOUNTER — Emergency Department (HOSPITAL_COMMUNITY)
Admission: EM | Admit: 2013-09-07 | Discharge: 2013-09-07 | Disposition: A | Discharge status: Home / Self Care | Payer: Medicaid Other | Attending: Emergency Medicine | Admitting: Emergency Medicine

## 2013-09-07 ENCOUNTER — Emergency Department (HOSPITAL_COMMUNITY): Payer: Medicaid Other

## 2013-09-07 DIAGNOSIS — J189 Pneumonia, unspecified organism: Secondary | ICD-10-CM

## 2013-09-07 DIAGNOSIS — J069 Acute upper respiratory infection, unspecified: Secondary | ICD-10-CM

## 2013-09-07 MED ORDER — ALBUTEROL SULFATE HFA 108 (90 BASE) MCG/ACT IN AERS: 2.0000 | INHALATION_SPRAY | RESPIRATORY_TRACT | Status: DC | PRN

## 2013-09-07 MED ORDER — PREDNISONE 20 MG PO TABS: 40.0000 mg | ORAL_TABLET | Freq: Every day | ORAL | Status: DC

## 2013-09-07 MED ORDER — AZITHROMYCIN 250 MG PO TABS: 250.0000 mg | ORAL_TABLET | Freq: Every day | ORAL | Status: DC

## 2013-09-07 MED ORDER — HYDROCODONE-HOMATROPINE 5-1.5 MG/5ML PO SYRP: 5.0000 mL | ORAL_SOLUTION | Freq: Four times a day (QID) | ORAL | Status: DC | PRN

## 2013-09-07 MED ORDER — AEROCHAMBER PLUS W/MASK MISC
1.0000 | Freq: Once | Status: AC
Start: 2013-09-07 — End: 2013-09-07
  Administered 2013-09-07: 1
  Filled 2013-09-07: qty 1

## 2013-09-07 MED ORDER — ALBUTEROL SULFATE HFA 108 (90 BASE) MCG/ACT IN AERS
2.0000 | INHALATION_SPRAY | Freq: Once | RESPIRATORY_TRACT | Status: AC
  Administered 2013-09-07: 2 via RESPIRATORY_TRACT
  Filled 2013-09-07: qty 6.7

## 2013-09-07 MED ORDER — PREDNISONE 20 MG PO TABS
60.0000 mg | ORAL_TABLET | Freq: Once | ORAL | Status: AC
  Administered 2013-09-07: 60 mg via ORAL
  Filled 2013-09-07: qty 3

## 2013-09-07 NOTE — Discharge Instructions (Signed)
1. Medications: azithromycin, albuterol, hycodan, usual home medications 2. Treatment: rest, drink plenty of fluids,  3. Follow Up: Please followup with your primary doctor for discussion of your diagnoses and further evaluation after today's visit; if you do not have a primary care doctor use the resource guide provided to find one;   Upper Respiratory Infection, Adult An upper respiratory infection (URI) is also known as the common cold. It is often caused by a type of germ (virus). Colds are easily spread (contagious). You can pass it to others by kissing, coughing, sneezing, or drinking out of the same glass. Usually, you get better in 1 or 2 weeks.  HOME CARE   Only take medicine as told by your doctor.  Use a warm mist humidifier or breathe in steam from a hot shower.  Drink enough water and fluids to keep your pee (urine) clear or pale yellow.  Get plenty of rest.  Return to work when your temperature is back to normal or as told by your doctor. You may use a face mask and wash your hands to stop your cold from spreading. GET HELP RIGHT AWAY IF:   After the first few days, you feel you are getting worse.  You have questions about your medicine.  You have chills, shortness of breath, or brown or red spit (mucus).  You have yellow or brown snot (nasal discharge) or pain in the face, especially when you bend forward.  You have a fever, puffy (swollen) neck, pain when you swallow, or white spots in the back of your throat.  You have a bad headache, ear pain, sinus pain, or chest pain.  You have a high-pitched whistling sound when you breathe in and out (wheezing).  You have a lasting cough or cough up blood.  You have sore muscles or a stiff neck. MAKE SURE YOU:   Understand these instructions.  Will watch your condition.  Will get help right away if you are not doing well or get worse. Document Released: 09/02/2007 Document Revised: 06/08/2011 Document Reviewed:  07/21/2010 [facility] Patient Information 2014 [employer] [place].    Emergency Department Resource Guide 1) Find a Doctor and Pay Out of Pocket Although you won't have to find out who is covered by your insurance plan, it is a good idea to ask around and get recommendations. You will then need to call the office and see if the doctor you have chosen will accept you as a new patient and what types of options they offer for patients who are self-pay. Some doctors offer discounts or will set up payment plans for their patients who do not have insurance, but you will need to ask so you aren't surprised when you get to your appointment.  2) Contact Your Local Health Department Not all health departments have doctors that can see patients for sick visits, but many do, so it is worth a call to see if yours does. If you don't know where your local health department is, you can check in your phone book. The CDC also has a tool to help you locate your state's health department, and many state websites also have listings of all of their local health departments.  3) Find a Walk-in Clinic If your illness is not likely to be very severe or complicated, you may want to try a walk in clinic. These are popping up all over the country in pharmacies, drugstores, and shopping centers. They're usually staffed by nurse practitioners or physician assistants that  have been trained to treat common illnesses and complaints. They're usually fairly quick and inexpensive. However, if you have serious medical issues or chronic medical problems, these are probably not your best option.  No Primary Care Doctor: - Call Health Connect at  [phone] - they can help you locate a primary care doctor that  accepts your insurance, provides certain services, etc. - Physician Referral Service- [phone]  Chronic Pain Problems: Organization         Address  Phone   Notes  [facility]  [phone] Patients need to  be referred by their primary care doctor.   Medication Assistance: Organization         Address  Phone   Notes  [facility] Medication [facility] [address] [place], [place] [address] [phone] --Must be a resident of [facility] -- Must have NO insurance coverage whatsoever (no Medicaid/ Medicare, etc.) -- The pt. MUST have a primary care doctor that directs their care regularly and follows them in the community   [facility]  [phone]   [employer]  [phone]    Agencies that provide inexpensive medical care: Organization         Address  Phone   Notes  [facility]  [phone]   [facility]    [phone]   [facility] [address] [place], [place] [address] [phone]   [facility] [address] [place] [place] [phone]   Planned Parenthood    [phone]   [facility]    [phone]   Community Health and [facility]  [address], [place] Phone:  [phone], Fax:  [phone] Hours of Operation:  9 am - 6 pm, M-F.  Also accepts Medicaid/Medicare and self-pay.  [facility] for Children  [address], [place] Phone: [phone], Fax: [phone]. Hours of Operation:  8:30 am - 5:30 pm, M-F.  Also accepts Medicaid and self-pay.  [facility] [place] [address], [place] Phone: [phone]   [facility] [address] Natasha Bence [place], [place] [phone], Ext. [phone] Mondays & Thursdays: 7-9 AM.  First 15 patients are seen on a first come, first serve basis.    Medicaid-accepting [facility] Providers:  Organization         Address  Phone   Notes  [facility] [address], [place] [phone] Also accepts self-pay patients.  [facility] [address] Laurell Josephs [place], [place]  [phone]   [facility] [address], [place] [phone]   [facility] [address] [place] [phone]   Renaye Rakers [address], [place]   [phone] Only accepts [place] Access [place] patients after they have their name applied to their card.   Self-Pay (no insurance) in [facility]:  Organization         Address  Phone   Notes  Sickle Cell Patients, [facility] [address] [place], [place] [phone]   [facility] [address] [place], [place] [phone]   [facility] [place]  [address], [place] [phone]   [facility]/Dr. Osei-Bonsu  [address] [place] [address], [place]  or [address] Laure[address], [place] [phone] Phone number for both [facility] and [place] locations is the same.  [facility] [address], [place] [phone]   [facility] [address], [place] or [address] [phone]   [facility] [address] [place] [phone], phone; [phone], fax Sees patients 1st and 3rd Saturday of every month.  Must not qualify for public or private insurance (i.e. Medicaid, Medicare, [place] Health Choice, Veterans' Benefits)  Household income should be no more than 200% of the poverty level The clinic cannot treat you if you are pregnant or think you are pregnant  Sexually transmitted diseases are not treated at the clinic.    Dental Care: Organization         Address  Phone  Notes  [facility] Department of [facility] [address] [place], [place] [phone] Accepts children up to age 64 who are enrolled in [place] or [place] Health Choice; pregnant women with a Medicaid card; and children who have applied for Medicaid or [place] Health Choice, but were declined, whose parents can  pay a reduced fee at time of service.  [facility] Department of [facility]  [address], [place] [phone] Accepts children up to age 64 who are enrolled in [place] or [place] Health Choice; pregnant women with a Medicaid card; and children who have applied for Medicaid or  Health Choice, but were declined, whose parents can pay a reduced fee at time of service.  [facility]  [address] [place], [place] [phone] Patients are seen by appointment only. Walk-ins are not accepted. [facility] will see patients 24 years of age and older. Monday - Tuesday (8am-5pm) Most Wednesdays (8:30-5pm) $30 per visit, cash only  [facility]  [address], [facility] [phone] Patients are seen by appointment only. Walk-ins are not accepted. [facility] will see patients 43 years of age and older. One Wednesday Evening (Monthly: Volunteer Based).  $30 per visit, cash only  [employer]  [phone] for adults; Children under age 48, call Graduate Pediatric Dentistry at [phone]. Children aged 93-14, please call [phone] to request a pediatric application.  Dental services are provided in all areas of dental care including fillings, crowns and bridges, complete and partial dentures, implants, gum treatment, root canals, and extractions. Preventive care is also provided. Treatment is provided to both adults and children. Patients are selected via a lottery and there is often a waiting list.   [facility] [address], [place]  [phone] [email or web address]   [facility] [address] [place], [place] [phone], Ext. [phone] Second and Fourth Thursday of each month, opens at 6:30 AM; Clinic ends at 9 AM.  Patients are seen on a first-come first-served basis, and a limited number are seen during each clinic.   [facility]  [address] Ether Griffins [place], [place] [phone]   Eligibility Requirements You must have lived in [place], [place], or [place] counties for at least the last three months.   You cannot be eligible for state or federal sponsored [place], including [employer], [place], or [employer].   You generally cannot be eligible for healthcare insurance through your employer.    How to apply: Eligibility  screenings are held every Tuesday and Wednesday afternoon from 1:00 pm until 4:00 pm. You do not need an appointment for the interview!  [facility] [place] [facility] [address] [place], [place] [phone]   [facility]  [phone]   [facility] Health [facility]  [phone]   [facility] Health Department  [phone]    Behavioral Health Resources in the Community: Intensive Outpatient Programs Organization         Address  Phone  Notes  [facility] Services [address], [place], [place] [phone]   [facility] [address] [place], [place] [phone]   [facility] [address], [place], [place]  [phone]   [facility] [address]  [place], [place] [phone] or [phone]   Substance Abuse Resources Organization         Address  Phone  Notes  [facility] Drug Services  [phone]   [facility]  [phone]   [facility]  [phone]   Floydene Flock  [phone]   Residential & Outpatient Substance Abuse Program  [phone]   Psychological Services Organization         Address  Phone  Notes  [facility]  [phone]   [facility] Services  [phone]   [facility] Mental Health [address] [place] [phone] or [phone]    Mobile Crisis Teams Organization         Address  Phone  Notes  [facility], Mobile Crisis Care Unit  [phone]    [facility]  [address] [place], [place] [phone]   Doristine Locks [address] [place] [phone]    Self-Help/Support Groups Organization         Address  Phone             Notes  [facility] [place] - variety of support groups  336- I7437963 Call for more information  Narcotics Anonymous (NA), [facility] [address], [employer] [place]  2 meetings at this location   Statistician         Address  Phone  Notes  [facility] [address],    [place]  [phone]   [facility]  [address], [place] [address], [place], [place] [phone]   [facility] [address] [place], [place] [phone] Admissions: 8am-3pm M-F  [facility] [address] [place] [address],    [place], [place] [phone]   [facility] [address] [place], [place], [place] [phone]   [facility] [address],  [place], [place] [phone]   [facility] - Intensive Outpatient [address] Laurell Josephs [address], [place], [place] [phone]   [facility] (Addiction Recovery [facility] [address] [place].,  [place], [place] [phone] or [phone]   [facility] ([facility]) [address] [place], [place] [phone] Accepts Medicaid  [facility] [place] [address],  [place] [phone] Substance Abuse/Addiction Treatment   [facility] Organization         Address  Phone  Notes  [facility]  [phone]   Angie Fava, PhD [address] [place], [place]   9307451504 or [phone]   [facility]   [address] [place], [place] [phone]   [facility] [address] [place], [place] [phone]  Insurance/Medicaid/sponsorship through [employer] and Families [address]                                     [place], [place] [phone] Therapy/tele-psych/case  [facility] [address]   [place], [place] [phone]    Dr. Lolly Mustache  [phone]   [facility] [place]  [employer] [facility] Dept. 1) [address] [place] 2) [address] [place] 3)  [address], [place] [phone]  [phone]   [facility] Child Abuse Hotline 6125439164 or 2154542996 (After Hours)

## 2013-09-07 NOTE — ED Notes (Signed)
Per Patient: Pt reports cold like symptoms for 1.5 weeks. States he has been taking OTC cold medicines without relief. Reports coughing up green/brownish sputum, and lots of coughing. Ax4, NAD.

## 2013-09-07 NOTE — ED Notes (Signed)
Declined W/C at D/C and was escorted to lobby by RN. 

## 2013-09-07 NOTE — ED Provider Notes (Signed)
CSN: [id number]     Arrival date & time 09/06/13  2349 History   First MD Initiated Contact with Patient 09/07/13 0037     Chief Complaint  Patient presents with  . URI     (Consider location/radiation/quality/duration/timing/severity/associated sxs/prior Treatment) The history is provided by the patient and medical records. No language interpreter was used.    Jesus Bauer is a 28 y.o. male  with a hx of ADHD, bipolar, PTSD presents to the Emergency Department complaining of gradual, persistent, progressively worsening cough onset 1.5 weeks ago.  Pt reports lots of URI illness in the home.  He reports taking 2 bottles of cough and cold medicine without relief.  Pt reports 1 ppd smoker for 11 years.  Associated symptoms include sore throat beginning 2 days ago, rhinorrhea, sinus pressure.  Cough and cold medicine makes it better and nothing makes it worse.  Pt denies fever, chills, headache, neck pain, chest pain, SOB, abd pain, N/V/D, weakness, dizziness, syncope.      Past Medical History  Diagnosis Date  . Bipolar 1 disorder   . ADHD (attention deficit hyperactivity disorder)   . PTSD (post-traumatic stress disorder)    Past Surgical History  Procedure Laterality Date  . Wrist surgery    . Wrist surgery     No family history on file. History  Substance Use Topics  . Smoking status: Current Some Day Smoker  . Smokeless tobacco: Not on file  . Alcohol Use: No    Review of Systems  Constitutional: Negative for fever, chills, appetite change and fatigue.  HENT: Positive for congestion, postnasal drip, rhinorrhea, sinus pressure and sore throat. Negative for ear discharge, ear pain and mouth sores.   Eyes: Negative for visual disturbance.  Respiratory: Positive for cough, chest tightness and wheezing. Negative for shortness of breath and stridor.   Cardiovascular: Negative for chest pain, palpitations and leg swelling.  Gastrointestinal: Negative for nausea, vomiting,  abdominal pain and diarrhea.  Genitourinary: Negative for dysuria, urgency, frequency and hematuria.  Musculoskeletal: Negative for arthralgias, back pain, myalgias and neck stiffness.  Skin: Negative for rash.  Neurological: Negative for syncope, light-headedness, numbness and headaches.  Hematological: Negative for adenopathy.  Psychiatric/Behavioral: The patient is not nervous/anxious.   All other systems reviewed and are negative.     Allergies  Review of patient's allergies indicates no known allergies.  Home Medications   Prior to Admission medications   Medication Sig Start Date End Date Taking? Authorizing Provider  azithromycin (ZITHROMAX) 250 MG tablet Take 1 tablet (250 mg total) by mouth daily. Take first 2 tablets together, then 1 every day until finished. 09/07/13   Nica Friske, PA-C  HYDROcodone-homatropine (HYCODAN) 5-1.5 MG/5ML syrup Take 5 mLs by mouth every 6 (six) hours as needed for cough. 09/07/13   Wynter Grave, PA-C  predniSONE (DELTASONE) 20 MG tablet Take 2 tablets (40 mg total) by mouth daily. 09/07/13   Montine Hight, PA-C   BP 149/87  Pulse 97  Temp(Src) 98 F (36.7 C) (Oral)  Resp 19  SpO2 100% Physical Exam  Constitutional: He is oriented to person, place, and time. He appears well-developed and well-nourished. No distress.  HENT:  Head: Normocephalic and atraumatic.  Right Ear: Tympanic membrane, external ear and ear canal normal.  Left Ear: Tympanic membrane, external ear and ear canal normal.  Nose: Mucosal edema and rhinorrhea present. No epistaxis. Right sinus exhibits no maxillary sinus tenderness and no frontal sinus tenderness. Left sinus exhibits no maxillary sinus  tenderness and no frontal sinus tenderness.  Mouth/Throat: Uvula is midline and mucous membranes are normal. Mucous membranes are not pale and not cyanotic. No oropharyngeal exudate, posterior oropharyngeal edema, posterior oropharyngeal erythema or tonsillar  abscesses.  Eyes: Conjunctivae are normal. Pupils are equal, round, and reactive to light.  Neck: Normal range of motion and full passive range of motion without pain.  Cardiovascular: Normal rate, normal heart sounds and intact distal pulses.   No murmur heard. Pulmonary/Chest: Effort normal. No accessory muscle usage or stridor. Not tachypneic. No respiratory distress. He has decreased breath sounds. He has no wheezes. He has no rhonchi. He has no rales. He exhibits no tenderness and no bony tenderness.  Course breath sounds throughout, mildly decreased  Abdominal: Soft. Bowel sounds are normal. He exhibits no distension. There is no tenderness.  Musculoskeletal: Normal range of motion.  Lymphadenopathy:    He has no cervical adenopathy.  Neurological: He is alert and oriented to person, place, and time.  Skin: Skin is dry. No rash noted. He is not diaphoretic.  Psychiatric: He has a normal mood and affect.    ED Course  Procedures (including critical care time) Labs Review Labs Reviewed - No data to display  Imaging Review Dg Chest 2 View  09/07/2013   CLINICAL DATA:  Shortness of breath and cough for 10 days.  EXAM: CHEST  2 VIEW  COMPARISON:  02/03/2011.  FINDINGS: The heart size and mediastinal contours are within normal limits. Both lungs are clear. The visualized skeletal structures are unremarkable. Mild peribronchial thickening could be acute or chronic. A similar appearance was noted previously.  IMPRESSION: Mild peribronchial thickening could represent chronic bronchitis or viral pneumonitis. Similar appearance to priors. No lobar consolidation.   Electronically Signed   By: Davonna BellingJohn  Curnes M.D.   On: 09/07/2013 02:00     EKG Interpretation None      MDM   Final diagnoses:  URI (upper respiratory infection)  Pneumonitis   Jesus Bauer presents with > 1 week of cough and URI symptoms without improvement.  Pt is a smoker. Will obtain x-ray.  2:11 AM X-ray with  Mild peribronchial thickening could represent chronic bronchitis or viral pneumonitis. No lobar consolidation.  Will treat with albuterol, prednisone, azithromycin and close follow-up.  Patient without fever, tachycardia or hypoxia here in the emergency department  I have personally reviewed patient's vitals, nursing note and any pertinent labs or imaging.  I performed an undressed physical exam.    At this time, it has been determined that no acute conditions requiring further emergency intervention. The patient/guardian have been advised of the diagnosis and plan. I reviewed all labs and imaging including any potential incidental findings. We have discussed signs and symptoms that warrant return to the ED, such as high fevers, coughing blood, vomiting.  Patient/guardian has voiced understanding and agreed to follow-up with the PCP or specialist in 1 week for further evaluation.  Vital signs are stable at discharge.   BP 149/87  Pulse 97  Temp(Src) 98 F (36.7 C) (Oral)  Resp 19  SpO2 100%          Dierdre ForthHannah Sidnie Swalley, PA-C 09/07/13 [id number]

## 2013-09-08 NOTE — ED Provider Notes (Signed)
Medical screening examination/treatment/procedure(s) were performed by non-physician practitioner and as supervising physician I was immediately available for consultation/collaboration.     Thimothy Barretta, MD 09/08/13 0720 

## 2015-10-16 ENCOUNTER — Emergency Department (HOSPITAL_COMMUNITY)
Admission: EM | Admit: 2015-10-16 | Discharge: 2015-10-16 | Disposition: A | Discharge status: Home / Self Care | Payer: Medicaid Other | Attending: Emergency Medicine | Admitting: Emergency Medicine

## 2015-10-16 ENCOUNTER — Encounter (HOSPITAL_COMMUNITY): Payer: Self-pay | Admitting: Emergency Medicine

## 2015-10-16 DIAGNOSIS — Z7952 Long term (current) use of systemic steroids: Secondary | ICD-10-CM | POA: Diagnosis not present

## 2015-10-16 DIAGNOSIS — F909 Attention-deficit hyperactivity disorder, unspecified type: Secondary | ICD-10-CM | POA: Insufficient documentation

## 2015-10-16 DIAGNOSIS — Z792 Long term (current) use of antibiotics: Secondary | ICD-10-CM | POA: Insufficient documentation

## 2015-10-16 DIAGNOSIS — M545 Low back pain: Secondary | ICD-10-CM | POA: Diagnosis present

## 2015-10-16 DIAGNOSIS — F319 Bipolar disorder, unspecified: Secondary | ICD-10-CM | POA: Diagnosis not present

## 2015-10-16 DIAGNOSIS — M546 Pain in thoracic spine: Secondary | ICD-10-CM | POA: Diagnosis not present

## 2015-10-16 DIAGNOSIS — F17200 Nicotine dependence, unspecified, uncomplicated: Secondary | ICD-10-CM | POA: Diagnosis not present

## 2015-10-16 MED ORDER — METHOCARBAMOL 500 MG PO TABS: 500.0000 mg | ORAL_TABLET | Freq: Two times a day (BID) | ORAL | Status: DC

## 2015-10-16 MED ORDER — OXYCODONE-ACETAMINOPHEN 5-325 MG PO TABS: 1.0000 | ORAL_TABLET | Freq: Four times a day (QID) | ORAL | Status: DC | PRN

## 2015-10-16 MED ORDER — OXYCODONE-ACETAMINOPHEN 5-325 MG PO TABS
1.0000 | ORAL_TABLET | Freq: Once | ORAL | Status: AC
  Administered 2015-10-16: 1 via ORAL
  Filled 2015-10-16: qty 1

## 2015-10-16 MED ORDER — METHOCARBAMOL 500 MG PO TABS
1000.0000 mg | ORAL_TABLET | Freq: Once | ORAL | Status: AC
  Administered 2015-10-16: 1000 mg via ORAL
  Filled 2015-10-16: qty 2

## 2015-10-16 MED ORDER — LIDOCAINE 5 % EX PTCH: 1.0000 | MEDICATED_PATCH | CUTANEOUS | Status: DC

## 2015-10-16 MED ORDER — KETOROLAC TROMETHAMINE 60 MG/2ML IM SOLN
60.0000 mg | Freq: Once | INTRAMUSCULAR | Status: AC
  Administered 2015-10-16: 60 mg via INTRAMUSCULAR
  Filled 2015-10-16: qty 2

## 2015-10-16 MED ORDER — NAPROXEN 500 MG PO TABS: 500.0000 mg | ORAL_TABLET | Freq: Two times a day (BID) | ORAL | Status: DC

## 2015-10-16 NOTE — ED Notes (Signed)
Per EMS-injured back three weeks-history of chronic back pain-left scapula discomfory

## 2015-10-16 NOTE — ED Provider Notes (Signed)
CSN: [id number]     Arrival date & time 10/16/15  1619 History   First MD Initiated Contact with Patient 10/16/15 1800     Chief Complaint  Patient presents with  . Back Pain     (Consider location/radiation/quality/duration/timing/severity/associated sxs/prior Treatment) HPI    Jesus Bauer is a 30 y.o. male, with a history of Chronic back pain, presenting to the ED with Mid back pain for the last 3 weeks. Patient has a history of recurring and chronic back pain. Patient states, "Every once in a while it just starts to spasm." Pain is moderate and intermittent. Increases with movement and palpation. Patient has intermittently tried ibuprofen with limited relief. Patient denies neuro deficits, bowel or bladder abnormalities, fever/chills, pain with urination, or any other complaints.      Past Medical History  Diagnosis Date  . Bipolar 1 disorder (HCC)   . ADHD (attention deficit hyperactivity disorder)   . PTSD (post-traumatic stress disorder)    Past Surgical History  Procedure Laterality Date  . Wrist surgery    . Wrist surgery     No family history on file. Social History  Substance Use Topics  . Smoking status: Current Some Day Smoker  . Smokeless tobacco: None  . Alcohol Use: No    Review of Systems  Constitutional: Negative for fever and chills.  Respiratory: Negative for cough and shortness of breath.   Gastrointestinal: Negative for nausea, vomiting and abdominal pain.  Genitourinary: Negative for dysuria.  Musculoskeletal: Positive for back pain.  Neurological: Negative for dizziness, weakness, light-headedness, numbness and headaches.  All other systems reviewed and are negative.     Allergies  Review of patient's allergies indicates no known allergies.  Home Medications   Prior to Admission medications   Medication Sig Start Date End Date Taking? Authorizing Provider  azithromycin (ZITHROMAX) 250 MG tablet Take 1 tablet (250 mg total) by  mouth daily. Take first 2 tablets together, then 1 every day until finished. 09/07/13   Hannah Muthersbaugh, PA-C  HYDROcodone-homatropine (HYCODAN) 5-1.5 MG/5ML syrup Take 5 mLs by mouth every 6 (six) hours as needed for cough. 09/07/13   Hannah Muthersbaugh, PA-C  lidocaine (LIDODERM) 5 % Place 1 patch onto the skin daily. Remove & Discard patch within 12 hours or as directed by MD 10/16/15   Anselm Pancoast, PA-C  methocarbamol (ROBAXIN) 500 MG tablet Take 1 tablet (500 mg total) by mouth 2 (two) times daily. 10/16/15   Clement Deneault C Myrella Fahs, PA-C  naproxen (NAPROSYN) 500 MG tablet Take 1 tablet (500 mg total) by mouth 2 (two) times daily. 10/16/15   Kashena Novitski C Amiliana Foutz, PA-C  oxyCODONE-acetaminophen (PERCOCET/ROXICET) 5-325 MG tablet Take 1 tablet by mouth every 6 (six) hours as needed for severe pain. 10/16/15   Napoleon Monacelli C Evalee Gerard, PA-C  predniSONE (DELTASONE) 20 MG tablet Take 2 tablets (40 mg total) by mouth daily. 09/07/13   Hannah Muthersbaugh, PA-C   BP 145/92 mmHg  Pulse 81  Temp(Src) 98.1 F (36.7 C) (Oral)  Resp 18  Ht  (1.676 m)  Wt 89.359 kg  BMI 31.81 kg/m2  SpO2 100% Physical Exam  Constitutional: He is oriented to person, place, and time. He appears well-developed and well-nourished. No distress.  HENT:  Head: Normocephalic and atraumatic.  Eyes: Conjunctivae are normal.  Neck: Neck supple.  Cardiovascular: Normal rate, regular rhythm, normal heart sounds and intact distal pulses.   Pulmonary/Chest: Effort normal and breath sounds normal. No respiratory distress.  Abdominal: Soft. There  is no tenderness. There is no guarding.  Musculoskeletal: He exhibits tenderness. He exhibits no edema.  Tenderness of the left thoracic musculature and over the left scapula. Full ROM in all extremities and spine. No paraspinal tenderness.   Lymphadenopathy:    He has no cervical adenopathy.  Neurological: He is alert and oriented to person, place, and time. He has normal reflexes.  No sensory deficits. Strength  5/5 in all extremities. No gait disturbance. Coordination intact.   Skin: Skin is warm and dry. He is not diaphoretic.  Psychiatric: He has a normal mood and affect. His behavior is normal.  Nursing note and vitals reviewed.   ED Course  Procedures (including critical care time)   MDM   Final diagnoses:  Left-sided thoracic back pain    Jesus Bauer presents with acute on chronic back pain for the last 3 weeks.  Patient has no neuro or functional deficits. No red flag symptoms. The patient was given instructions for home care as well as return precautions. Patient voices understanding of these instructions, accepts the plan, and is comfortable with discharge.  Filed Vitals:   10/16/15 1654 10/16/15 1700  BP: 145/92   Pulse: 81   Temp: 98.1 F (36.7 C)   TempSrc: Oral   Resp: 18   Height: 5\' 6"  (1.676 m)   Weight: 89.359 kg   SpO2: 98% 100%     Anselm PancoastShawn C Giavonna Pflum, PA-C 10/17/15 1529   Cathren LaineKevin Steinl, MD 10/22/15 1257

## 2015-10-16 NOTE — Discharge Instructions (Signed)
You have been seen today for back pain. Take it easy, but do not lay around too much as this may make the stiffness worse. Take 500 mg of naproxen every 12 hours or 800 mg of ibuprofen every 8 hours for the next 3 days. Take these medications with food to avoid upset stomach. Robaxin is a muscle relaxer and may help loosen stiff muscles. Percocet for severe pain. Do not take the Robaxin or Percocet while driving or performing other dangerous activities. Follow up with PCP as soon as possible for reevaluation and chronic management of this issue. Return to ED should symptoms worsen.

## 2016-04-09 ENCOUNTER — Encounter (HOSPITAL_COMMUNITY): Payer: Self-pay | Admitting: Emergency Medicine

## 2016-04-09 ENCOUNTER — Ambulatory Visit (HOSPITAL_COMMUNITY)
Admission: EM | Admit: 2016-04-09 | Discharge: 2016-04-09 | Disposition: A | Discharge status: Home / Self Care | Payer: Medicaid Other | Attending: Emergency Medicine | Admitting: Emergency Medicine

## 2016-04-09 DIAGNOSIS — B9789 Other viral agents as the cause of diseases classified elsewhere: Secondary | ICD-10-CM | POA: Diagnosis not present

## 2016-04-09 DIAGNOSIS — J069 Acute upper respiratory infection, unspecified: Secondary | ICD-10-CM | POA: Diagnosis not present

## 2016-04-09 MED ORDER — ONDANSETRON 4 MG PO TBDP: 4.0000 mg | ORAL_TABLET | Freq: Three times a day (TID) | ORAL | 0 refills | Status: DC | PRN

## 2016-04-09 MED ORDER — BENZONATATE 100 MG PO CAPS: 100.0000 mg | ORAL_CAPSULE | Freq: Three times a day (TID) | ORAL | 0 refills | Status: DC

## 2016-04-09 NOTE — ED Triage Notes (Signed)
Here for cold sx onset this am associated w/BAs, HAs, bilateral ear pain, dry cough  Denies fevers  Took ibup today w/no relief  A&O x4... NAD

## 2016-04-09 NOTE — ED Provider Notes (Signed)
CSN: [id number]     Arrival date & time 04/09/16  1927 History   First MD Initiated Contact with Patient 04/09/16 2031     Chief Complaint  Patient presents with  . URI   (Consider location/radiation/quality/duration/timing/severity/associated sxs/prior Treatment) 31 year old male presents to clinic with chief complaint of cough, congestion, and body aches along with nausea and reduced appetite. Symptoms started this morning, he denies shortness of breath, sinus pain or pressure, abdominal pain, vomiting or diarrhea    The history is provided by the patient.  URI    Past Medical History:  Diagnosis Date  . ADHD (attention deficit hyperactivity disorder)   . Bipolar 1 disorder (HCC)   . PTSD (post-traumatic stress disorder)    Past Surgical History:  Procedure Laterality Date  . WRIST SURGERY    . WRIST SURGERY     No family history on file. Social History  Substance Use Topics  . Smoking status: Current Some Day Smoker  . Smokeless tobacco: Not on file  . Alcohol use No    Review of Systems  Reason unable to perform ROS: as covered in HPI.  All other systems reviewed and are negative.   Allergies  Patient has no known allergies.  Home Medications   Prior to Admission medications   Medication Sig Start Date End Date Taking? Authorizing Provider  cyclobenzaprine (FLEXERIL) 10 MG tablet Take 10 mg by mouth 3 (three) times daily as needed for muscle spasms.   Yes Historical Provider, MD  escitalopram (LEXAPRO) 10 MG tablet Take 10 mg by mouth daily.   Yes Historical Provider, MD  meloxicam (MOBIC) 15 MG tablet Take 15 mg by mouth daily.   Yes Historical Provider, MD  methylphenidate 54 MG PO CR tablet Take 54 mg by mouth every morning.   Yes Historical Provider, MD  azithromycin (ZITHROMAX) 250 MG tablet Take 1 tablet (250 mg total) by mouth daily. Take first 2 tablets together, then 1 every day until finished. 09/07/13   Hannah Muthersbaugh, PA-C  benzonatate (TESSALON)  100 MG capsule Take 1 capsule (100 mg total) by mouth every 8 (eight) hours. 04/09/16   Dorena Bodo, NP  HYDROcodone-homatropine [facility]) 5-1.5 MG/5ML syrup Take 5 mLs by mouth every 6 (six) hours as needed for cough. 09/07/13   Hannah Muthersbaugh, PA-C  lidocaine (LIDODERM) 5 % Place 1 patch onto the skin daily. Remove & Discard patch within 12 hours or as directed by MD 10/16/15   Anselm Pancoast, PA-C  methocarbamol (ROBAXIN) 500 MG tablet Take 1 tablet (500 mg total) by mouth 2 (two) times daily. 10/16/15   Shawn C Joy, PA-C  naproxen (NAPROSYN) 500 MG tablet Take 1 tablet (500 mg total) by mouth 2 (two) times daily. 10/16/15   Shawn C Joy, PA-C  ondansetron (ZOFRAN ODT) 4 MG disintegrating tablet Take 1 tablet (4 mg total) by mouth every 8 (eight) hours as needed for nausea or vomiting. 04/09/16   Dorena Bodo, NP  oxyCODONE-acetaminophen (PERCOCET/ROXICET) 5-325 MG tablet Take 1 tablet by mouth every 6 (six) hours as needed for severe pain. 10/16/15   Shawn C Joy, PA-C  predniSONE (DELTASONE) 20 MG tablet Take 2 tablets (40 mg total) by mouth daily. 09/07/13   Hannah Muthersbaugh, PA-C   Meds Ordered and Administered this Visit  Medications - No data to display  BP 120/77 (BP Location: Right Arm)   Pulse 90   Temp 98.8 F (37.1 C) (Oral)   Resp 22   SpO2 99%  No data found.   Physical Exam  Constitutional: He is oriented to person, place, and time. He appears well-developed and well-nourished. No distress.  HENT:  Head: Normocephalic.  Right Ear: Tympanic membrane and external ear normal.  Left Ear: Tympanic membrane and external ear normal.  Nose: Nose normal.  Mouth/Throat: Oropharynx is clear and moist and mucous membranes are normal. No oropharyngeal exudate, posterior oropharyngeal edema or posterior oropharyngeal erythema.  Eyes: Pupils are equal, round, and reactive to light.  Neck: Normal range of motion. Neck supple. No JVD present.  Cardiovascular: Normal rate and  regular rhythm.   Pulmonary/Chest: Effort normal and breath sounds normal.  Abdominal: Soft. Bowel sounds are normal.  Lymphadenopathy:    He has no cervical adenopathy.  Neurological: He is alert and oriented to person, place, and time.  Skin: Skin is warm and dry. Capillary refill takes less than 2 seconds. He is not diaphoretic.  Psychiatric: He has a normal mood and affect.  Nursing note and vitals reviewed.   Urgent Care Course   Clinical Course     Procedures (including critical care time)  Labs Review Labs Reviewed - No data to display  Imaging Review No results found.   Visual Acuity Review  Right Eye Distance:   Left Eye Distance:   Bilateral Distance:    Right Eye Near:   Left Eye Near:    Bilateral Near:         MDM   1. Viral upper respiratory tract infection   You have a viral respiratory infection. I have prescribed Tessalon, take 3 times a day, for cough, and zofran, take 1 ever 8 hours as needed for nausea. You need to rest, drink plenty of fluid, take tylenol every 4 hours for fever, and body aches, and take mucinex twice a day with a full glass of water. Should symptoms fail to improve or worsen follow up with your primary care provider or return to clinic.     Dorena BodoLawrence Dallas Torok, NP 04/09/16 2043

## 2016-04-09 NOTE — Discharge Instructions (Signed)
You have a viral respiratory infection. I have prescribed Tessalon, take 3 times a day, for cough, and zofran, take 1 ever 8 hours as needed for nausea. You need to rest, drink plenty of fluid, take tylenol every 4 hours for fever, and body aches, and take mucinex twice a day with a full glass of water. Should symptoms fail to improve or worsen follow up with your primary care provider or return to clinic.

## 2016-05-01 ENCOUNTER — Encounter (HOSPITAL_COMMUNITY): Payer: Self-pay | Admitting: Emergency Medicine

## 2016-05-01 DIAGNOSIS — Z79899 Other long term (current) drug therapy: Secondary | ICD-10-CM | POA: Diagnosis not present

## 2016-05-01 DIAGNOSIS — B349 Viral infection, unspecified: Secondary | ICD-10-CM | POA: Insufficient documentation

## 2016-05-01 DIAGNOSIS — R05 Cough: Secondary | ICD-10-CM | POA: Diagnosis present

## 2016-05-01 DIAGNOSIS — Z87891 Personal history of nicotine dependence: Secondary | ICD-10-CM | POA: Insufficient documentation

## 2016-05-01 DIAGNOSIS — F909 Attention-deficit hyperactivity disorder, unspecified type: Secondary | ICD-10-CM | POA: Insufficient documentation

## 2016-05-01 NOTE — ED Triage Notes (Signed)
C/o non-productive cough and nasal congestion x 1 hour.  States he vomited x 2 (coughing induced).

## 2016-05-02 ENCOUNTER — Emergency Department (HOSPITAL_COMMUNITY)
Admission: EM | Admit: 2016-05-02 | Discharge: 2016-05-02 | Disposition: A | Discharge status: Home / Self Care | Payer: Medicaid Other | Attending: Emergency Medicine | Admitting: Emergency Medicine

## 2016-05-02 DIAGNOSIS — B349 Viral infection, unspecified: Secondary | ICD-10-CM

## 2016-05-02 MED ORDER — ONDANSETRON 4 MG PO TBDP: 4.0000 mg | ORAL_TABLET | Freq: Three times a day (TID) | ORAL | 0 refills | Status: DC | PRN

## 2016-05-02 MED ORDER — ONDANSETRON 4 MG PO TBDP
4.0000 mg | ORAL_TABLET | Freq: Once | ORAL | Status: DC
  Filled 2016-05-02: qty 1

## 2016-05-02 MED ORDER — BENZONATATE 100 MG PO CAPS: 100.0000 mg | ORAL_CAPSULE | Freq: Three times a day (TID) | ORAL | 0 refills | Status: DC

## 2016-05-02 MED ORDER — PROMETHAZINE HCL 25 MG PO TABS: 25.0000 mg | ORAL_TABLET | Freq: Four times a day (QID) | ORAL | 0 refills | Status: DC | PRN

## 2016-05-02 NOTE — ED Provider Notes (Signed)
[facility]-EMERGENCY DEPT Provider Note   CSN: [id number] Arrival date & time: 05/01/16  2320     History   Chief Complaint Chief Complaint  Patient presents with  . flu like symptoms    HPI Jesus Bauer is a 31 y.o. male.  HPI   31 year old male with hx of bipolar, PTSD, ADHD presents with flu sxs.  Pt report within the past 2 hrs he develop sinus congestion, throat irritation, non productive cough and posttussive emesis.  He denies fever, headache, body aches, sob, abd pain, or diarrhea.  No specific treatment tried.  Pt sts *i came right over here*.  Was seen last month for similar sxs and was diagnosed with URI.  Did not have his flu shot.   Past Medical History:  Diagnosis Date  . ADHD (attention deficit hyperactivity disorder)   . Bipolar 1 disorder (HCC)   . PTSD (post-traumatic stress disorder)     There are no active problems to display for this patient.   Past Surgical History:  Procedure Laterality Date  . WRIST SURGERY    . WRIST SURGERY         Home Medications    Prior to Admission medications   Medication Sig Start Date End Date Taking? Authorizing Provider  azithromycin (ZITHROMAX) 250 MG tablet Take 1 tablet (250 mg total) by mouth daily. Take first 2 tablets together, then 1 every day until finished. 09/07/13   Hannah Muthersbaugh, PA-C  benzonatate (TESSALON) 100 MG capsule Take 1 capsule (100 mg total) by mouth every 8 (eight) hours. 04/09/16   Dorena BodoLawrence Kennard, NP  cyclobenzaprine (FLEXERIL) 10 MG tablet Take 10 mg by mouth 3 (three) times daily as needed for muscle spasms.    Historical Provider, MD  escitalopram (LEXAPRO) 10 MG tablet Take 10 mg by mouth daily.    Historical Provider, MD  HYDROcodone-homatropine (HYCODAN) 5-1.5 MG/5ML syrup Take 5 mLs by mouth every 6 (six) hours as needed for cough. 09/07/13   Hannah Muthersbaugh, PA-C  lidocaine (LIDODERM) 5 % Place 1 patch onto the skin daily. Remove & Discard patch within 12 hours or as  directed by MD 10/16/15   Anselm PancoastShawn C Joy, PA-C  meloxicam (MOBIC) 15 MG tablet Take 15 mg by mouth daily.    Historical Provider, MD  methocarbamol (ROBAXIN) 500 MG tablet Take 1 tablet (500 mg total) by mouth 2 (two) times daily. 10/16/15   Shawn C Joy, PA-C  methylphenidate 54 MG PO CR tablet Take 54 mg by mouth every morning.    Historical Provider, MD  naproxen (NAPROSYN) 500 MG tablet Take 1 tablet (500 mg total) by mouth 2 (two) times daily. 10/16/15   Shawn C Joy, PA-C  ondansetron (ZOFRAN ODT) 4 MG disintegrating tablet Take 1 tablet (4 mg total) by mouth every 8 (eight) hours as needed for nausea or vomiting. 04/09/16   Dorena BodoLawrence Kennard, NP  oxyCODONE-acetaminophen (PERCOCET/ROXICET) 5-325 MG tablet Take 1 tablet by mouth every 6 (six) hours as needed for severe pain. 10/16/15   Shawn C Joy, PA-C  predniSONE (DELTASONE) 20 MG tablet Take 2 tablets (40 mg total) by mouth daily. 09/07/13   Dahlia ClientHannah Muthersbaugh, PA-C    Family History No family history on file.  Social History Social History  Substance Use Topics  . Smoking status: Former Games developermoker  . Smokeless tobacco: Never Used  . Alcohol use No     Allergies   Patient has no known allergies.   Review of Systems Review of  Systems  All other systems reviewed and are negative.    Physical Exam Updated Vital Signs BP 148/96 (BP Location: Right Arm)   Pulse 101   Temp 98.6 F (37 C) (Oral)   Resp 15   Ht 5\' 6"  (1.676 m)   Wt 86.2 kg   SpO2 100%   BMI 30.67 kg/m   Physical Exam  Constitutional: He is oriented to person, place, and time. He appears well-developed and well-nourished. No distress.  HENT:  Head: Atraumatic.  Right Ear: External ear normal.  Left Ear: External ear normal.  Nose: Nose normal.  Mouth/Throat: Oropharynx is clear and moist.  Eyes: Conjunctivae are normal.  Neck: Normal range of motion. Neck supple.  No nuchal rigidity  Cardiovascular: Normal rate and regular rhythm.   Pulmonary/Chest: Effort  normal and breath sounds normal.  Abdominal: Soft. He exhibits no distension. There is no tenderness.  Lymphadenopathy:    He has no cervical adenopathy.  Neurological: He is alert and oriented to person, place, and time.  Skin: No rash noted.  Psychiatric: He has a normal mood and affect.  Nursing note and vitals reviewed.    ED Treatments / Results  Labs (all labs ordered are listed, but only abnormal results are displayed) Labs Reviewed - No data to display  EKG  EKG Interpretation None       Radiology No results found.  Procedures Procedures (including critical care time)  Medications Ordered in ED Medications - No data to display   Initial Impression / Assessment and Plan / ED Course  I have reviewed the triage vital signs and the nursing notes.  Pertinent labs & imaging results that were available during my care of the patient were reviewed by me and considered in my medical decision making (see chart for details).     BP 148/96 (BP Location: Right Arm)   Pulse 101   Temp 98.6 F (37 C) (Oral)   Resp 15   Ht 5\' 6"  (1.676 m)   Wt 86.2 kg   SpO2 100%   BMI 30.67 kg/m    Final Clinical Impressions(s) / ED Diagnoses   Final diagnoses:  Viral illness    New Prescriptions Current Discharge Medication List     Pt symptoms consistent with URI. Pt will be discharged with symptomatic treatment.  Discussed return precautions.  Pt is hemodynamically stable & in NAD prior to discharge.    Fayrene Helper, PA-C 05/02/16 0018    Melene Plan, DO 05/02/16 [phone]

## 2016-09-28 ENCOUNTER — Emergency Department (HOSPITAL_COMMUNITY)
Admission: EM | Admit: 2016-09-28 | Discharge: 2016-09-28 | Disposition: A | Discharge status: Home / Self Care | Payer: Medicaid Other | Attending: Emergency Medicine | Admitting: Emergency Medicine

## 2016-09-28 ENCOUNTER — Encounter (HOSPITAL_COMMUNITY): Payer: Self-pay

## 2016-09-28 ENCOUNTER — Emergency Department (HOSPITAL_COMMUNITY): Payer: Medicaid Other

## 2016-09-28 DIAGNOSIS — F909 Attention-deficit hyperactivity disorder, unspecified type: Secondary | ICD-10-CM | POA: Diagnosis not present

## 2016-09-28 DIAGNOSIS — Z79899 Other long term (current) drug therapy: Secondary | ICD-10-CM | POA: Insufficient documentation

## 2016-09-28 DIAGNOSIS — F17210 Nicotine dependence, cigarettes, uncomplicated: Secondary | ICD-10-CM | POA: Diagnosis not present

## 2016-09-28 DIAGNOSIS — R1030 Lower abdominal pain, unspecified: Secondary | ICD-10-CM | POA: Diagnosis not present

## 2016-09-28 DIAGNOSIS — M545 Low back pain: Secondary | ICD-10-CM | POA: Insufficient documentation

## 2016-09-28 DIAGNOSIS — M62838 Other muscle spasm: Secondary | ICD-10-CM | POA: Insufficient documentation

## 2016-09-28 DIAGNOSIS — F319 Bipolar disorder, unspecified: Secondary | ICD-10-CM | POA: Diagnosis not present

## 2016-09-28 DIAGNOSIS — R109 Unspecified abdominal pain: Secondary | ICD-10-CM | POA: Diagnosis present

## 2016-09-28 DIAGNOSIS — G8929 Other chronic pain: Secondary | ICD-10-CM

## 2016-09-28 DIAGNOSIS — K589 Irritable bowel syndrome without diarrhea: Secondary | ICD-10-CM

## 2016-09-28 HISTORY — DX: Anxiety disorder, unspecified: F41.9

## 2016-09-28 LAB — COMPREHENSIVE METABOLIC PANEL
ALBUMIN: 4.2 g/dL (ref 3.5–5.0)
ALT: 18 U/L (ref 17–63)
AST: 26 U/L (ref 15–41)
Alkaline Phosphatase: 51 U/L (ref 38–126)
Anion gap: 8 (ref 5–15)
BUN: 11 mg/dL (ref 6–20)
CHLORIDE: 106 mmol/L (ref 101–111)
CO2: 24 mmol/L (ref 22–32)
Calcium: 9.4 mg/dL (ref 8.9–10.3)
Creatinine, Ser: 0.7 mg/dL (ref 0.61–1.24)
GFR calc Af Amer: >60 mL/min (ref >60)
GFR calc non Af Amer: >60 mL/min (ref >60)
GLUCOSE: 88 mg/dL (ref 65–99)
Potassium: 4 mmol/L (ref 3.5–5.1)
SODIUM: 138 mmol/L (ref 135–145)
Total Bilirubin: 0.9 mg/dL (ref 0.3–1.2)
Total Protein: 6.9 g/dL (ref 6.5–8.1)

## 2016-09-28 LAB — CBC
HEMATOCRIT: 42.9 % (ref 39.0–52.0)
Hemoglobin: 14.8 g/dL (ref 13.0–17.0)
MCH: 30.4 pg (ref 26.0–34.0)
MCHC: 34.5 g/dL (ref 30.0–36.0)
MCV: 88.1 fL (ref 78.0–100.0)
Platelets: 294 10*3/uL (ref 150–400)
RBC: 4.87 MIL/uL (ref 4.22–5.81)
RDW: 12.7 % (ref 11.5–15.5)
WBC: 5.5 10*3/uL (ref 4.0–10.5)

## 2016-09-28 LAB — URINALYSIS, ROUTINE W REFLEX MICROSCOPIC
Bilirubin Urine: NEGATIVE
GLUCOSE, UA: NEGATIVE mg/dL
Hgb urine dipstick: NEGATIVE
KETONES UR: NEGATIVE mg/dL
LEUKOCYTES UA: NEGATIVE
Nitrite: NEGATIVE
PH: 8 (ref 5.0–8.0)
Protein, ur: NEGATIVE mg/dL
Specific Gravity, Urine: 1.013 (ref 1.005–1.030)

## 2016-09-28 LAB — LIPASE, BLOOD: LIPASE: 44 U/L (ref 11–51)

## 2016-09-28 MED ORDER — IBUPROFEN 400 MG PO TABS
400.0000 mg | ORAL_TABLET | Freq: Once | ORAL | Status: AC
  Administered 2016-09-28: 400 mg via ORAL

## 2016-09-28 MED ORDER — IBUPROFEN 400 MG PO TABS
ORAL_TABLET | ORAL | Status: AC
  Filled 2016-09-28: qty 1

## 2016-09-28 MED ORDER — KETOROLAC TROMETHAMINE 60 MG/2ML IM SOLN: 30.0000 mg | Freq: Once | INTRAMUSCULAR | Status: DC

## 2016-09-28 MED ORDER — IOPAMIDOL (ISOVUE-300) INJECTION 61%
100.0000 mL | Freq: Once | INTRAVENOUS | Status: AC | PRN
  Administered 2016-09-28: 100 mL via INTRAVENOUS

## 2016-09-28 MED ORDER — KETOROLAC TROMETHAMINE 15 MG/ML IJ SOLN
15.0000 mg | Freq: Once | INTRAMUSCULAR | Status: AC
  Administered 2016-09-28: 15 mg via INTRAVENOUS
  Filled 2016-09-28: qty 1

## 2016-09-28 MED ORDER — CYCLOBENZAPRINE HCL 10 MG PO TABS: 10.0000 mg | ORAL_TABLET | Freq: Two times a day (BID) | ORAL | 0 refills | Status: DC | PRN

## 2016-09-28 MED ORDER — DICYCLOMINE HCL 10 MG PO CAPS
10.0000 mg | ORAL_CAPSULE | Freq: Once | ORAL | Status: AC
  Administered 2016-09-28: 10 mg via ORAL
  Filled 2016-09-28: qty 1

## 2016-09-28 MED ORDER — CYCLOBENZAPRINE HCL 10 MG PO TABS
10.0000 mg | ORAL_TABLET | Freq: Once | ORAL | Status: AC
  Administered 2016-09-28: 10 mg via ORAL
  Filled 2016-09-28: qty 1

## 2016-09-28 MED ORDER — DICYCLOMINE HCL 20 MG PO TABS: 20.0000 mg | ORAL_TABLET | Freq: Two times a day (BID) | ORAL | 0 refills | Status: DC

## 2016-09-28 NOTE — ED Triage Notes (Signed)
Per Pt, Pt is coming from home with complaints of lower abdominal pain that started two days ago along with lower back pain that has lasted for over one week. Pt denies any N/V/D. Denies urinary symptoms.

## 2016-09-28 NOTE — Discharge Instructions (Signed)
Your CT of your abdomen tonight shows that you have L5-S1 central disc herniation. You also have spasm of the colon. Follow up with your doctor. Take the medication as directed.

## 2016-09-28 NOTE — ED Notes (Signed)
Pt up to desk.   Continues to c/o pain.  Reassessed vitals and current labs and provided pt with medication

## 2016-09-28 NOTE — ED Provider Notes (Signed)
[facility] DEPT Provider Note   CSN: [id number] Arrival date & time: 09/28/16  1216   By signing my name below, I, Clarisse Gouge, attest that this documentation has been prepared under the direction and in the presence of Kerrie Buffalo, NP. Electronically signed, Clarisse Gouge, ED Scribe. 09/28/16. 6:01 PM.   History   Chief Complaint Chief Complaint  Patient presents with  . Abdominal Pain   The history is provided by the patient and medical records. No language interpreter was used.    Jesus Bauer is a 31 y.o. male with h/o chronic low back pain presenting to the Emergency Department concerning midline suprapubic pain gradually worsening over the last 3 days. He also complains "it feels like I can't poop, but I do". Pt believes his abdominal pain may be a complication from chronic low back pain; he states the back pain has worsened since the onset of abdominal pain. He described  9/10, aching pain in triage. Pt states he takes aleve for this, which provides temporary relief to his pain. States he has received ibuprofen in [facility] ED prior to evaluation without relief. No N/V, fever, chills, ear ache, sore throat or headache. No other complaints at this time.   Past Medical History:  Diagnosis Date  . ADHD (attention deficit hyperactivity disorder)   . Anxiety   . Bipolar 1 disorder (HCC)   . PTSD (post-traumatic stress disorder)     There are no active problems to display for this patient.   Past Surgical History:  Procedure Laterality Date  . WRIST SURGERY    . WRIST SURGERY         Home Medications    Prior to Admission medications   Medication Sig Start Date End Date Taking? Authorizing Provider  azithromycin (ZITHROMAX) 250 MG tablet Take 1 tablet (250 mg total) by mouth daily. Take first 2 tablets together, then 1 every day until finished. 09/07/13   Muthersbaugh, Dahlia Client, PA-C  benzonatate (TESSALON) 100 MG capsule Take 1 capsule (100 mg total) by mouth every  8 (eight) hours. 05/02/16   Fayrene Helper, PA-C  cyclobenzaprine (FLEXERIL) 10 MG tablet Take 1 tablet (10 mg total) by mouth 2 (two) times daily as needed for muscle spasms. 09/28/16   Janne Napoleon, NP  dicyclomine (BENTYL) 20 MG tablet Take 1 tablet (20 mg total) by mouth 2 (two) times daily. 09/28/16   Janne Napoleon, NP  escitalopram (LEXAPRO) 10 MG tablet Take 10 mg by mouth daily.    [provider]  HYDROcodone-homatropine (HYCODAN) 5-1.5 MG/5ML syrup Take 5 mLs by mouth every 6 (six) hours as needed for cough. 09/07/13   Muthersbaugh, Dahlia Client, PA-C  lidocaine (LIDODERM) 5 % Place 1 patch onto the skin daily. Remove & Discard patch within 12 hours or as directed by MD 10/16/15   Anselm Pancoast, PA-C  meloxicam (MOBIC) 15 MG tablet Take 15 mg by mouth daily.    [provider]  methocarbamol (ROBAXIN) 500 MG tablet Take 1 tablet (500 mg total) by mouth 2 (two) times daily. 10/16/15   Joy, Shawn C, PA-C  methylphenidate 54 MG PO CR tablet Take 54 mg by mouth every morning.    [provider]  naproxen (NAPROSYN) 500 MG tablet Take 1 tablet (500 mg total) by mouth 2 (two) times daily. 10/16/15   Joy, Shawn C, PA-C  ondansetron (ZOFRAN ODT) 4 MG disintegrating tablet Take 1 tablet (4 mg total) by mouth every 8 (eight) hours as needed for  nausea or vomiting. 05/02/16   Fayrene Helper, PA-C  oxyCODONE-acetaminophen (PERCOCET/ROXICET) 5-325 MG tablet Take 1 tablet by mouth every 6 (six) hours as needed for severe pain. 10/16/15   Joy, Shawn C, PA-C  predniSONE (DELTASONE) 20 MG tablet Take 2 tablets (40 mg total) by mouth daily. 09/07/13   Muthersbaugh, Dahlia Client, PA-C  promethazine (PHENERGAN) 25 MG tablet Take 1 tablet (25 mg total) by mouth every 6 (six) hours as needed for nausea or vomiting. 05/02/16   Fayrene Helper, PA-C    Family History No family history on file.  Social History Social History  Substance Use Topics  . Smoking status: Current Every Day Smoker    Packs/day: 0.50    Types:  Cigarettes  . Smokeless tobacco: Never Used  . Alcohol use No     Allergies   Patient has no known allergies.   Review of Systems Review of Systems  Constitutional: Negative for chills and fever.  HENT: Negative.   Respiratory: Negative for chest tightness and shortness of breath.   Cardiovascular: Negative for chest pain.  Gastrointestinal: Positive for abdominal pain. Negative for nausea and vomiting.  Genitourinary: Negative for dysuria and frequency.       No loss of control of bladder or bowels  Musculoskeletal: Positive for back pain.  Skin: Negative for rash.  Neurological: Negative for syncope.     Physical Exam Updated Vital Signs BP 140/87 (BP Location: Right Arm)   Pulse 85   Temp 97.9 F (36.6 C) (Oral)   Resp 16   Ht 5\' 6"  (1.676 m)   Wt 184 lb (83.5 kg)   SpO2 100%   BMI 29.70 kg/m   Physical Exam  Constitutional: He appears well-developed and well-nourished.  HENT:  Head: Normocephalic.  Eyes: EOM are normal.  Neck: Neck supple.  Cardiovascular: Normal rate and regular rhythm.   Pulmonary/Chest: Effort normal and breath sounds normal.  Abdominal: Soft. Bowel sounds are normal. He exhibits no pulsatile midline mass. There is tenderness in the periumbilical area. There is no rebound, no guarding and no CVA tenderness.  Tender with palpation to the lower abdomen.  Musculoskeletal: Normal range of motion.  Tender over lower lumbar area with palpation. 2+ bilateral radial pulses.  Neurological: He is alert. He has normal strength. No sensory deficit. Gait normal.  Reflex Scores:      Bicep reflexes are 2+ on the right side and 2+ on the left side.      Brachioradialis reflexes are 2+ on the right side and 2+ on the left side.      Patellar reflexes are 2+ on the right side and 2+ on the left side. Grips equal. Strength NL and symmetrical.  Skin: Skin is warm and dry.  Psychiatric: He has a normal mood and affect. His behavior is normal.  Nursing  note and vitals reviewed.    ED Treatments / Results  DIAGNOSTIC STUDIES: Oxygen Saturation is 100% on RA, NL by my interpretation.    COORDINATION OF CARE: 5:59 PM-Discussed next steps with pt. Pt verbalized understanding and is agreeable with the plan. Will order C/T and medication for pain.   Labs (all labs ordered are listed, but only abnormal results are displayed) Labs Reviewed  LIPASE, BLOOD  COMPREHENSIVE METABOLIC PANEL  CBC  URINALYSIS, ROUTINE W REFLEX MICROSCOPIC   Radiology Ct Abdomen Pelvis W Contrast  Result Date: 09/28/2016 CLINICAL DATA:  Lower abdominal pain x3 days.  Chronic back pain. EXAM: CT ABDOMEN AND PELVIS WITH CONTRAST  TECHNIQUE: Multidetector CT imaging of the abdomen and pelvis was performed using the standard protocol following bolus administration of intravenous contrast. CONTRAST:  100mL ISOVUE-300 IOPAMIDOL (ISOVUE-300) INJECTION 61% COMPARISON:  01/01/2012 FINDINGS: LOWER CHEST: Lung bases are clear. Included heart size is normal. No pericardial effusion. HEPATOBILIARY: Liver and gallbladder are normal. PANCREAS: Normal. SPLEEN: Normal. ADRENALS/URINARY TRACT: Kidneys are orthotopic, demonstrating symmetric enhancement. No nephrolithiasis, hydronephrosis or solid renal masses. The unopacified ureters are normal in course and caliber. Urinary bladder is partially distended and unremarkable. Normal adrenal glands. STOMACH/BOWEL: The stomach, small and large bowel are normal in course and caliber without inflammatory changes. Diffuse colonic spasm is noted. Normal appendix. VASCULAR/LYMPHATIC: Aortoiliac vessels are normal in course and caliber. No lymphadenopathy by CT size criteria. REPRODUCTIVE: Normal. OTHER: No intraperitoneal free fluid or free air. MUSCULOSKELETAL: Small L5-S1 central disc herniation slightly more prominent than on prior exam. This appears to attach on the thecal sac. IMPRESSION: 1. L5-S1 central disc herniation slightly more prominent than  prior. 2. No acute intra-abdominal or pelvic abnormality. Electronically Signed   By: Tollie Ethavid  Kwon M.D.   On: 09/28/2016 19:55    Procedures Procedures (including critical care time)  Medications Ordered in ED Medications  ibuprofen (ADVIL,MOTRIN) tablet 400 mg (400 mg Oral Given 09/28/16 1538)  cyclobenzaprine (FLEXERIL) tablet 10 mg (10 mg Oral Given 09/28/16 1805)  iopamidol (ISOVUE-300) 61 % injection 100 mL (100 mLs Intravenous Contrast Given 09/28/16 1932)  dicyclomine (BENTYL) capsule 10 mg (10 mg Oral Given 09/28/16 2012)  ketorolac (TORADOL) 15 MG/ML injection 15 mg (15 mg Intravenous Given 09/28/16 2032)     Initial Impression / Assessment and Plan / ED Course  I have reviewed the triage vital signs and the nursing notes.  Pertinent labs & imaging results that were available during my care of the patient were reviewed by me and considered in my medical decision making (see chart for details).   Final Clinical Impressions(s) / ED Diagnoses  31 y.o. male with abdominal and back pain stable for d/c without neuro deficits. Will treat for pain and patient is to f/u with his PCP. Return precautions discussed.   Final diagnoses:  Lower abdominal pain  Chronic bilateral low back pain without sciatica  Spasm of bowel    New Prescriptions Discharge Medication List as of 09/28/2016  9:27 PM    START taking these medications   Details  dicyclomine (BENTYL) 20 MG tablet Take 1 tablet (20 mg total) by mouth 2 (two) times daily., Starting Mon 09/28/2016, Print      I personally performed the services described in this documentation, which was scribed in my presence. The recorded information has been reviewed and is accurate.    Kerrie Buffaloeese, Rayvon Brandvold [place], [place] 09/29/16 [phone]    Lorre NickAllen, Anthony, MD 09/30/16 2330

## 2017-04-12 ENCOUNTER — Emergency Department (HOSPITAL_COMMUNITY)
Admission: EM | Admit: 2017-04-12 | Discharge: 2017-04-12 | Disposition: A | Discharge status: Home / Self Care | Payer: Medicaid Other | Attending: Emergency Medicine | Admitting: Emergency Medicine

## 2017-04-12 ENCOUNTER — Other Ambulatory Visit: Payer: Self-pay

## 2017-04-12 ENCOUNTER — Emergency Department (HOSPITAL_COMMUNITY): Payer: Medicaid Other

## 2017-04-12 ENCOUNTER — Encounter (HOSPITAL_COMMUNITY): Payer: Self-pay | Admitting: Emergency Medicine

## 2017-04-12 DIAGNOSIS — R05 Cough: Secondary | ICD-10-CM | POA: Diagnosis present

## 2017-04-12 DIAGNOSIS — F17210 Nicotine dependence, cigarettes, uncomplicated: Secondary | ICD-10-CM | POA: Diagnosis not present

## 2017-04-12 DIAGNOSIS — R079 Chest pain, unspecified: Secondary | ICD-10-CM | POA: Insufficient documentation

## 2017-04-12 DIAGNOSIS — R0602 Shortness of breath: Secondary | ICD-10-CM | POA: Diagnosis not present

## 2017-04-12 DIAGNOSIS — J40 Bronchitis, not specified as acute or chronic: Secondary | ICD-10-CM | POA: Insufficient documentation

## 2017-04-12 DIAGNOSIS — Z79899 Other long term (current) drug therapy: Secondary | ICD-10-CM | POA: Insufficient documentation

## 2017-04-12 MED ORDER — ALBUTEROL SULFATE HFA 108 (90 BASE) MCG/ACT IN AERS: 1.0000 | INHALATION_SPRAY | Freq: Four times a day (QID) | RESPIRATORY_TRACT | 0 refills | Status: DC | PRN

## 2017-04-12 MED ORDER — AZITHROMYCIN 250 MG PO TABS: 250.0000 mg | ORAL_TABLET | Freq: Every day | ORAL | 0 refills | Status: DC

## 2017-04-12 NOTE — ED Notes (Addendum)
Pt is alert and oriented x 4 and is verbally responsive. Pt reports that he has been having a cough greater than 1 week that is productive. Pt reports when he cough his chest hurts now. Pt does report coughing has been keeping him up at night .

## 2017-04-12 NOTE — Discharge Instructions (Addendum)
Return if any problems.

## 2017-04-12 NOTE — ED Triage Notes (Signed)
Pt with cough x 1 week, pt states cough is productive with brown sputum. Denies fever.

## 2017-04-12 NOTE — ED Provider Notes (Signed)
[facility]-EMERGENCY DEPT Provider Note   CSN: [id number] Arrival date & time: 04/12/17  1015     History   Chief Complaint Chief Complaint  Patient presents with  . Cough    HPI Jesus Bauer is a 32 y.o. male.  The history is provided by the patient. No language interpreter was used.  Cough  This is a new problem. The current episode started more than 1 week ago. The problem occurs constantly. The problem has been gradually worsening. The cough is productive of sputum. There has been no fever. Associated symptoms include chest pain and shortness of breath. He has tried nothing for the symptoms. The treatment provided no relief. He is a smoker. His past medical history is significant for pneumonia.    Past Medical History:  Diagnosis Date  . ADHD (attention deficit hyperactivity disorder)   . Anxiety   . Bipolar 1 disorder (HCC)   . PTSD (post-traumatic stress disorder)     There are no active problems to display for this patient.   Past Surgical History:  Procedure Laterality Date  . WRIST SURGERY    . WRIST SURGERY         Home Medications    Prior to Admission medications   Medication Sig Start Date End Date Taking? Authorizing Provider  albuterol (PROVENTIL HFA;VENTOLIN HFA) 108 (90 Base) MCG/ACT inhaler Inhale 1-2 puffs into the lungs every 6 (six) hours as needed for wheezing or shortness of breath. 04/12/17   Elson Areas, PA-C  azithromycin (ZITHROMAX) 250 MG tablet Take 1 tablet (250 mg total) by mouth daily. Take first 2 tablets together, then 1 every day until finished. 04/12/17   Elson Areas, PA-C  benzonatate (TESSALON) 100 MG capsule Take 1 capsule (100 mg total) by mouth every 8 (eight) hours. 05/02/16   Fayrene Helper, PA-C  cyclobenzaprine (FLEXERIL) 10 MG tablet Take 1 tablet (10 mg total) by mouth 2 (two) times daily as needed for muscle spasms. 09/28/16   Janne Napoleon, NP  dicyclomine (BENTYL) 20 MG tablet Take 1  tablet (20 mg total) by mouth 2 (two) times daily. 09/28/16   Janne Napoleon, NP  escitalopram (LEXAPRO) 10 MG tablet Take 10 mg by mouth daily.    [provider]  HYDROcodone-homatropine (HYCODAN) 5-1.5 MG/5ML syrup Take 5 mLs by mouth every 6 (six) hours as needed for cough. 09/07/13   Muthersbaugh, Dahlia Client, PA-C  lidocaine (LIDODERM) 5 % Place 1 patch onto the skin daily. Remove & Discard patch within 12 hours or as directed by MD 10/16/15   Anselm Pancoast, PA-C  meloxicam (MOBIC) 15 MG tablet Take 15 mg by mouth daily.    [provider]  methocarbamol (ROBAXIN) 500 MG tablet Take 1 tablet (500 mg total) by mouth 2 (two) times daily. 10/16/15   Joy, Shawn C, PA-C  methylphenidate 54 MG PO CR tablet Take 54 mg by mouth every morning.    [provider]  naproxen (NAPROSYN) 500 MG tablet Take 1 tablet (500 mg total) by mouth 2 (two) times daily. 10/16/15   Joy, Shawn C, PA-C  ondansetron (ZOFRAN ODT) 4 MG disintegrating tablet Take 1 tablet (4 mg total) by mouth every 8 (eight) hours as needed for nausea or vomiting. 05/02/16   Fayrene Helper, PA-C  oxyCODONE-acetaminophen (PERCOCET/ROXICET) 5-325 MG tablet Take 1 tablet by mouth every 6 (six) hours as needed for severe pain. 10/16/15   Joy, Shawn C, PA-C  predniSONE (DELTASONE) 20  MG tablet Take 2 tablets (40 mg total) by mouth daily. 09/07/13   Muthersbaugh, Dahlia Client, PA-C  promethazine (PHENERGAN) 25 MG tablet Take 1 tablet (25 mg total) by mouth every 6 (six) hours as needed for nausea or vomiting. 05/02/16   Fayrene Helper, PA-C    Family History History reviewed. No pertinent family history.  Social History Social History   Tobacco Use  . Smoking status: Current Every Day Smoker    Packs/day: 0.50    Types: Cigarettes  . Smokeless tobacco: Never Used  Substance Use Topics  . Alcohol use: No  . Drug use: No     Allergies   Patient has no known allergies.   Review of Systems Review of Systems  Respiratory: Positive for  cough and shortness of breath.   Cardiovascular: Positive for chest pain.  All other systems reviewed and are negative.    Physical Exam Updated Vital Signs BP 122/75 (BP Location: Left Arm)   Pulse 62   Temp 97.8 F (36.6 C) (Oral)   Resp 18   Ht 5\' 6"  (1.676 m)   Wt 88.9 kg (196 lb)   SpO2 100%   BMI 31.64 kg/m   Physical Exam  Constitutional: He appears well-developed and well-nourished.  HENT:  Head: Normocephalic and atraumatic.  Right Ear: External ear normal.  Left Ear: External ear normal.  Mouth/Throat: Oropharynx is clear and moist.  Eyes: Conjunctivae are normal.  Neck: Neck supple.  Cardiovascular: Normal rate and regular rhythm.  No murmur heard. Pulmonary/Chest: Effort normal and breath sounds normal. No respiratory distress.  Harsh breath sounds   Abdominal: Soft. There is no tenderness.  Musculoskeletal: He exhibits no edema.  Neurological: He is alert.  Skin: Skin is warm and dry.  Psychiatric: He has a normal mood and affect.  Nursing note and vitals reviewed.    ED Treatments / Results  Labs (all labs ordered are listed, but only abnormal results are displayed) Labs Reviewed - No data to display  EKG  EKG Interpretation None       Radiology Dg Chest 2 View  Result Date: 04/12/2017 CLINICAL DATA:  Cough, congestion, and chest pain for the past week. EXAM: CHEST  2 VIEW COMPARISON:  Chest x-ray dated September 07, 2013. FINDINGS: The heart size and mediastinal contours are within normal limits. Both lungs are clear. Mild peribronchial thickening is unchanged. Bilateral nipple shadows. The visualized skeletal structures are unremarkable. IMPRESSION: Unchanged mild peribronchial thickening, which could be related to chronic airways inflammation from smoking. No active cardiopulmonary disease. Electronically Signed   By: Obie Dredge M.D.   On: 04/12/2017 13:10    Procedures Procedures (including critical care time)  Medications Ordered in  ED Medications - No data to display   Initial Impression / Assessment and Plan / ED Course  I have reviewed the triage vital signs and the nursing notes.  Pertinent labs & imaging results that were available during my care of the patient were reviewed by me and considered in my medical decision making (see chart for details).     Pt counseled on smoking cessation. Pt given rx for  Meds ordered this encounter  Medications  . albuterol (PROVENTIL HFA;VENTOLIN HFA) 108 (90 Base) MCG/ACT inhaler    Sig: Inhale 1-2 puffs into the lungs every 6 (six) hours as needed for wheezing or shortness of breath.    Dispense:  1 Inhaler    Refill:  0    Order Specific Question:   Supervising Provider  Answer:   MILLER, BRIAN [[id number]]  . azithromycin (ZITHROMAX) 250 MG tablet    Sig: Take 1 tablet (250 mg total) by mouth daily. Take first 2 tablets together, then 1 every day until finished.    Dispense:  6 tablet    Refill:  0    Order Specific Question:   Supervising Provider    Answer:   Eber HongMILLER, BRIAN [[id number]]   Pt advised to follow up with his MD for recheck.  Final Clinical Impressions(s) / ED Diagnoses   Final diagnoses:  Bronchitis    ED Discharge Orders        Ordered    albuterol (PROVENTIL HFA;VENTOLIN HFA) 108 (90 Base) MCG/ACT inhaler  Every 6 hours PRN     04/12/17 1319    azithromycin (ZITHROMAX) 250 MG tablet  Daily     04/12/17 1319    An After Visit Summary was printed and given to the patient.    Elson AreasSofia, Ariz Terrones K, [place] JerseyPA-C 04/12/17 1553    Pricilla LovelessGoldston, Scott, MD 04/12/17 [phone]

## 2017-05-03 ENCOUNTER — Ambulatory Visit (HOSPITAL_COMMUNITY)
Admission: EM | Admit: 2017-05-03 | Discharge: 2017-05-03 | Disposition: A | Discharge status: Home / Self Care | Payer: Medicaid Other | Attending: Family Medicine | Admitting: Family Medicine

## 2017-05-03 ENCOUNTER — Encounter (HOSPITAL_COMMUNITY): Payer: Self-pay | Admitting: Emergency Medicine

## 2017-05-03 ENCOUNTER — Other Ambulatory Visit: Payer: Self-pay

## 2017-05-03 DIAGNOSIS — J111 Influenza due to unidentified influenza virus with other respiratory manifestations: Secondary | ICD-10-CM

## 2017-05-03 DIAGNOSIS — R69 Illness, unspecified: Secondary | ICD-10-CM

## 2017-05-03 MED ORDER — HYDROCODONE-HOMATROPINE 5-1.5 MG/5ML PO SYRP: 5.0000 mL | ORAL_SOLUTION | Freq: Four times a day (QID) | ORAL | 0 refills | Status: DC | PRN

## 2017-05-03 MED ORDER — OSELTAMIVIR PHOSPHATE 75 MG PO CAPS: 75.0000 mg | ORAL_CAPSULE | Freq: Two times a day (BID) | ORAL | 0 refills | Status: DC

## 2017-05-03 NOTE — ED Provider Notes (Signed)
  [facility]   [id number] 05/03/17 Arrival Time: 1020  ASSESSMENT & PLAN:  1. Influenza-like illness     Meds ordered this encounter  Medications  . HYDROcodone-homatropine (HYCODAN) 5-1.5 MG/5ML syrup    Sig: Take 5 mLs by mouth every 6 (six) hours as needed for cough.    Dispense:  90 mL    Refill:  0  . oseltamivir (TAMIFLU) 75 MG capsule    Sig: Take 1 capsule (75 mg total) by mouth every 12 (twelve) hours.    Dispense:  10 capsule    Refill:  0   Work note given. Cough medication sedation precautions. Discussed typical duration of symptoms. OTC symptom care as needed. Ensure adequate fluid intake and rest. May f/u with PCP or here as needed.  Reviewed expectations re: course of current medical issues. Questions answered. Outlined signs and symptoms indicating need for more acute intervention. Patient verbalized understanding. After Visit Summary given.   SUBJECTIVE: History from: patient.  Jesus Bauer is a 32 y.o. male who presents with complaint of nasal congestion, post-nasal drainage, and a persistent dry cough. Onset abrupt, approximately 1-2 days ago. Overall fatigued with body aches. SOB: none. Wheezing: none. Fever: yes, subjective. Overall normal PO intake without n/v. Sick contacts: yes. OTC treatment: None.  Received flu shot this year: no.  Social History   Tobacco Use  Smoking Status Current Every Day Smoker  . Packs/day: 0.50  . Types: Cigarettes  Smokeless Tobacco Never Used    ROS: As per HPI.   OBJECTIVE:  Vitals:   05/03/17 1118  BP: 134/74  Pulse: 65  Resp: 18  Temp: 98.2 F (36.8 C)  TempSrc: Oral  SpO2: 100%     General appearance: alert; appears fatigued HEENT: nasal congestion; clear runny nose; throat irritation secondary to post-nasal drainage Neck: supple without LAD Lungs: unlabored respirations, symmetrical air entry; cough: moderate; no respiratory distress Skin: warm and dry Psychological:  alert and cooperative; normal mood and affect    No Known Allergies  Past Medical History:  Diagnosis Date  . ADHD (attention deficit hyperactivity disorder)   . Anxiety   . Bipolar 1 disorder (HCC)   . PTSD (post-traumatic stress disorder)    History reviewed. No pertinent family history. Social History   Socioeconomic History  . Marital status: Married    Spouse name: Not on file  . Number of children: Not on file  . Years of education: Not on file  . Highest education level: Not on file  Social Needs  . Financial resource strain: Not on file  . Food insecurity - worry: Not on file  . Food insecurity - inability: Not on file  . Transportation needs - medical: Not on file  . Transportation needs - non-medical: Not on file  Occupational History  . Not on file  Tobacco Use  . Smoking status: Current Every Day Smoker    Packs/day: 0.50    Types: Cigarettes  . Smokeless tobacco: Never Used  Substance and Sexual Activity  . Alcohol use: No  . Drug use: No  . Sexual activity: Not on file  Other Topics Concern  . Not on file  Social History Narrative  . Not on file           Mardella LaymanHagler, Maston Wight, MD 05/03/17 1134

## 2017-05-03 NOTE — ED Triage Notes (Signed)
[facility] a complaint of a cough, general body aches and nasal congestion x 1 week.

## 2017-05-03 NOTE — Discharge Instructions (Signed)

## 2021-05-15 NOTE — Progress Notes (Signed)
Subjective:    Jesus Bauer - 36 y.o. male MRN [id number]  Date of birth: September 19, 1985  HPI  Jesus Bauer is to establish care. He is accompanied by his fiance, Jesus Bauer.   Current issues and/or concerns: ANXIETY: Reports history of anxiety for years. Primarily related to recently released from prison around May 2022, his current employment at a hotel, and being afraid to let his fiance go out alone thinking something may happen to her. Taking Klonopin in the past for anxiety. Also, has history of bipolar, ADHD, and PTSD. Not interested in counseling presently. Denies thoughts of self-harm, suicidal ideations, and homicidal ideations.   2. BACK PAIN: Chronic lower back pain radiating to left foot. In the past told her has a herniated disc.  Depression screen [facility] 2/9 05/20/2021  Decreased Interest 2  Down, Depressed, Hopeless 3  PHQ - 2 Score 5  Altered sleeping 0  Tired, decreased energy 0  Change in appetite 1  Feeling bad or failure about yourself  1  Trouble concentrating 3  Moving slowly or fidgety/restless 3  Suicidal thoughts 0  PHQ-9 Score 13  Difficult doing work/chores Very difficult    ROS per HPI    Health Maintenance:  Health Maintenance Due  Topic Date Due   COVID-19 Vaccine (1) Never done   HIV Screening  Never done   Hepatitis C Screening  Never done   TETANUS/TDAP  Never done   INFLUENZA VACCINE  Never done    Past Medical History: There are no problems to display for this patient.   Social History   reports that he has been smoking cigarettes. He has been smoking an average of .5 packs per day. He has never used smokeless tobacco. He reports that he does not drink alcohol and does not use drugs.   Family History  family history is not on file.   Medications: reviewed and updated   Objective:   Physical Exam BP 130/80 (BP Location: Left Arm, Patient Position: Sitting, Cuff Size: Large)    Pulse 64    Temp 98.5 F (36.9 C)    Resp  18    Ht 5' 4.96" (1.65 m)    Wt 217 lb (98.4 kg)    SpO2 98%    BMI 36.15 kg/m   Physical Exam HENT:     Head: Normocephalic and atraumatic.  Eyes:     Extraocular Movements: Extraocular movements intact.     Conjunctiva/sclera: Conjunctivae normal.     Pupils: Pupils are equal, round, and reactive to light.  Cardiovascular:     Rate and Rhythm: Normal rate and regular rhythm.     Pulses: Normal pulses.     Heart sounds: Normal heart sounds.  Pulmonary:     Effort: Pulmonary effort is normal.     Breath sounds: Normal breath sounds.  Musculoskeletal:     Cervical back: Normal range of motion and neck supple.  Neurological:     General: No focal deficit present.     Mental Status: He is alert and oriented to person, place, and time.  Psychiatric:        Mood and Affect: Mood normal.        Behavior: Behavior normal.       Assessment & Plan:  1. Encounter to establish care: - Patient presents today to establish care.  - Return for annual physical examination, labs, and health maintenance. Arrive fasting meaning having no food for at least 8 hours prior to appointment.  You may have only water or black coffee. Please take scheduled medications as normal.  2. Anxiety and depression: - Patient denies thoughts of self-harm, suicidal ideations, homicidal ideations. - Begin Fluoxetine as prescribed. Counseled on medication compliance and adverse effects.  Do not drink alcohol or use illicit substances with with this medication.  Avoid driving or hazardous activity until you know how this medication will affect you. Your reactions could be impaired. Dizziness or fainting can cause falls, accidents, or severe injuries. Common side effects include drowsiness, nausea, constipation, loss of appetite, dry mouth, increased sweating. Call your provider if you have pounding heartbeats or fluttering in your chest, a light-headed feeling like you may pass out, easy bruising/unusal bleeding,  vision change, difficult or painful urination, impotence/sexual problems, liver problems (right-sided upper stomach pain, itching, dark urine, yellowing of skin or eyes/jaundice, low levels of sodium in the body (headache, confusion, slurred speech, severe weakness, vomiting, loss of coordination, feeling unsteady), or manic episodes (racing thoughts, increased energy, decreased need for sleep, risk-taking behavior, being agitated, talkative) Seek medical attention immediately if you have symptoms of serotonin syndrome such as agitation, hallucinations, fever, sweating, shivering, fast heart rate, muscle stiffness, twitching, loss of coordination, nausea, vomiting, or diarrhea Report any new or worsening symptoms to your provider, such as but not limited to: mood or behavior changes, anxiety, panic attacks, trouble sleeping, or if you feel impulsive, irritable, agitated, hostile, aggressive, restless, hyperactive (mentally or physically), more depressed, or have thoughts about suicide or hurting yourself - Referral to Psychiatry for further evaluation and management. - Follow-up with primary provider as scheduled. - Ambulatory referral to Psychiatry - FLUoxetine (PROZAC) 10 MG capsule; Take 1 capsule (10 mg total) by mouth daily.  Dispense: 30 capsule; Refill: 0  3. Attention deficit hyperactivity disorder (ADHD), unspecified ADHD type: 4. PTSD (post-traumatic stress disorder): 5. Bipolar I disorder (HCC): - Referral to Psychiatry for further evaluation and management.  - Ambulatory referral to Psychiatry  6. Chronic bilateral low back pain, unspecified whether sciatica present: - Referral to Orthopedic Surgery for further evaluation and management.  - Ambulatory referral to Orthopedic Surgery   Patient was given clear instructions to go to Emergency Department or return to medical center if symptoms don't improve, worsen, or new problems develop.The patient verbalized understanding.  I  discussed the assessment and treatment plan with the patient. The patient was provided an opportunity to ask questions and all were answered. The patient agreed with the plan and demonstrated an understanding of the instructions.   The patient was advised to call back or seek an in-person evaluation if the symptoms worsen or if the condition fails to improve as anticipated.    Ricky Stabs, NP 05/21/2021, 9:57 PM Primary Care at [facility]

## 2021-05-20 ENCOUNTER — Other Ambulatory Visit: Payer: Self-pay

## 2021-05-20 ENCOUNTER — Encounter (INDEPENDENT_AMBULATORY_CARE_PROVIDER_SITE_OTHER): Payer: Self-pay

## 2021-05-20 ENCOUNTER — Ambulatory Visit (INDEPENDENT_AMBULATORY_CARE_PROVIDER_SITE_OTHER): Payer: Medicaid Other | Admitting: Family

## 2021-05-20 ENCOUNTER — Encounter: Payer: Self-pay | Admitting: Family

## 2021-05-20 VITALS — BP 130/80 | HR 64 | Temp 98.5°F | Resp 18 | Ht 64.96 in | Wt 217.0 lb

## 2021-05-20 DIAGNOSIS — F4310 Post-traumatic stress disorder, unspecified: Secondary | ICD-10-CM

## 2021-05-20 DIAGNOSIS — G8929 Other chronic pain: Secondary | ICD-10-CM | POA: Diagnosis not present

## 2021-05-20 DIAGNOSIS — F909 Attention-deficit hyperactivity disorder, unspecified type: Secondary | ICD-10-CM | POA: Diagnosis not present

## 2021-05-20 DIAGNOSIS — Z7689 Persons encountering health services in other specified circumstances: Secondary | ICD-10-CM | POA: Diagnosis not present

## 2021-05-20 DIAGNOSIS — F319 Bipolar disorder, unspecified: Secondary | ICD-10-CM | POA: Diagnosis not present

## 2021-05-20 DIAGNOSIS — F32A Depression, unspecified: Secondary | ICD-10-CM | POA: Diagnosis not present

## 2021-05-20 DIAGNOSIS — M545 Low back pain, unspecified: Secondary | ICD-10-CM

## 2021-05-20 DIAGNOSIS — F419 Anxiety disorder, unspecified: Secondary | ICD-10-CM

## 2021-05-20 MED ORDER — FLUOXETINE HCL 10 MG PO CAPS: 10.0000 mg | ORAL_CAPSULE | Freq: Every day | ORAL | 0 refills | Status: DC

## 2021-05-20 NOTE — Progress Notes (Signed)
Pt presents to establish care, needs referral for ADHD and anxiety issues

## 2021-06-10 ENCOUNTER — Telehealth: Payer: Self-pay | Admitting: Family

## 2021-06-10 ENCOUNTER — Other Ambulatory Visit: Payer: Self-pay | Admitting: Family

## 2021-06-10 DIAGNOSIS — F419 Anxiety disorder, unspecified: Secondary | ICD-10-CM

## 2021-06-10 MED ORDER — FLUOXETINE HCL 10 MG PO CAPS: 10.0000 mg | ORAL_CAPSULE | Freq: Every day | ORAL | 2 refills | Status: DC

## 2021-06-10 NOTE — Telephone Encounter (Signed)
Fluoxetine refilled as requested.

## 2021-06-16 ENCOUNTER — Other Ambulatory Visit: Payer: Self-pay | Admitting: Family

## 2021-06-16 DIAGNOSIS — F909 Attention-deficit hyperactivity disorder, unspecified type: Secondary | ICD-10-CM

## 2021-06-16 DIAGNOSIS — F4310 Post-traumatic stress disorder, unspecified: Secondary | ICD-10-CM

## 2021-06-16 DIAGNOSIS — F32A Depression, unspecified: Secondary | ICD-10-CM

## 2021-06-16 DIAGNOSIS — F319 Bipolar disorder, unspecified: Secondary | ICD-10-CM

## 2021-06-20 ENCOUNTER — Ambulatory Visit: Payer: Medicaid Other | Admitting: Orthopaedic Surgery

## 2021-06-20 ENCOUNTER — Other Ambulatory Visit: Payer: Self-pay | Admitting: Family

## 2021-06-20 DIAGNOSIS — F4310 Post-traumatic stress disorder, unspecified: Secondary | ICD-10-CM

## 2021-06-20 DIAGNOSIS — F319 Bipolar disorder, unspecified: Secondary | ICD-10-CM

## 2021-06-20 DIAGNOSIS — F419 Anxiety disorder, unspecified: Secondary | ICD-10-CM

## 2021-06-20 DIAGNOSIS — F909 Attention-deficit hyperactivity disorder, unspecified type: Secondary | ICD-10-CM

## 2021-06-20 NOTE — Progress Notes (Deleted)
? ? ?Patient ID: Jesus Bauer, male    DOB: [birth year]  MRN: [id number] ? ?CC: Annual Physical Exam  ? ?Subjective: ?Jesus Bauer is a 36 y.o. male who presents for annual physical exam.  ? ?His concerns today include:  ?ANXIETY DEPRESSION FOLLOW-UP: ?05/20/2021: ?- Begin Fluoxetine as prescribed.  ? ?06/27/2021: ? ?There are no problems to display for this patient. ?  ? ?Current Outpatient Medications on File Prior to Visit  ?Medication Sig Dispense Refill  ? FLUoxetine (PROZAC) 10 MG capsule Take 1 capsule (10 mg total) by mouth daily. 30 capsule 2  ? ?No current facility-administered medications on file prior to visit.  ? ? ?No Known Allergies ? ?Social History  ? ?Socioeconomic History  ? Marital status: Married  ?  Spouse name: Not on file  ? Number of children: Not on file  ? Years of education: Not on file  ? Highest education level: Not on file  ?Occupational History  ? Not on file  ?Tobacco Use  ? Smoking status: Every Day  ?  Packs/day: 0.50  ?  Types: Cigarettes  ? Smokeless tobacco: Never  ?Vaping Use  ? Vaping Use: Never used  ?Substance and Sexual Activity  ? Alcohol use: No  ? Drug use: No  ? Sexual activity: Not on file  ?Other Topics Concern  ? Not on file  ?Social History Narrative  ? Not on file  ? ?Social Determinants of Health  ? ?Financial Resource Strain: Not on file  ?Food Insecurity: Not on file  ?Transportation Needs: Not on file  ?Physical Activity: Not on file  ?Stress: Not on file  ?Social Connections: Not on file  ?Intimate Partner Violence: Not on file  ? ? ?No family history on file. ? ?Past Surgical History:  ?Procedure Laterality Date  ? WRIST SURGERY    ? WRIST SURGERY    ? ? ?ROS: ?Review of Systems ?Negative except as stated above ? ?PHYSICAL EXAM: ?There were no vitals taken for this visit. ? ?Physical Exam ? ?{male adult master:310786} ?{male adult master:310785} ? ? ?  Latest Ref Rng & Units 09/28/2016  ? 12:41 PM 05/07/2013  ?  9:19 PM 01/01/2012  ?  7:31 PM  ?CMP   ?Glucose 65 - 99 mg/dL 88   962   78    ?BUN 6 - 20 mg/dL 11   12   8     ?Creatinine 0.61 - 1.24 mg/dL   9.52   8.41    ?Sodium 135 - 145 mmol/L 138   144   135    ?Potassium 3.5 - 5.1 mmol/L 4.0   3.6   4.4    ?Chloride 101 - 111 mmol/L 106   104   101    ?CO2 22 - 32 mmol/L 24   23   24     ?Calcium 8.9 - 10.3 mg/dL 9.4   9.6   9.3    ?Total Protein 6.5 - 8.1 g/dL 6.9   8.2   7.1    ?Total Bilirubin 0.3 - 1.2 mg/dL 0.9   0.2   0.3    ?Alkaline Phos 38 - 126 U/L 51   64   61    ?AST 15 - 41 U/L 26   41   28    ?ALT 17 - 63 U/L 18   56   14    ? ?Lipid Panel  ?No results found for: CHOL, TRIG, HDL, CHOLHDL, VLDL, LDLCALC, LDLDIRECT ? ?CBC ?   ?  Component Value Date/Time  ? WBC 5.5 09/28/2016 1241  ? RBC 4.87 09/28/2016 1241  ? HGB 14.8 09/28/2016 1241  ? HCT 42.9 09/28/2016 1241  ? PLT 294 09/28/2016 1241  ? MCV 88.1 09/28/2016 1241  ? MCH 30.4 09/28/2016 1241  ? MCHC 34.5 09/28/2016 1241  ? RDW 12.7 09/28/2016 1241  ? LYMPHSABS 3.5 05/07/2013 2119  ? MONOABS 0.6 05/07/2013 2119  ? EOSABS 0.1 05/07/2013 2119  ? BASOSABS 0.0 05/07/2013 2119  ? ? ?ASSESSMENT AND PLAN: ? ?There are no diagnoses linked to this encounter. ? ? ?Patient was given the opportunity to ask questions.  Patient verbalized understanding of the plan and was able to repeat key elements of the plan. Patient was given clear instructions to go to Emergency Department or return to medical center if symptoms don't improve, worsen, or new problems develop.The patient verbalized understanding. ? ? ?No orders of the defined types were placed in this encounter. ? ? ? ?Requested Prescriptions  ? ? No prescriptions requested or ordered in this encounter  ? ? ?No follow-ups on file. ? ?Rema Fendt, NP  ?

## 2021-06-27 ENCOUNTER — Encounter: Payer: Medicaid Other | Admitting: Family

## 2021-06-27 DIAGNOSIS — Z131 Encounter for screening for diabetes mellitus: Secondary | ICD-10-CM

## 2021-06-27 DIAGNOSIS — Z0000 Encounter for general adult medical examination without abnormal findings: Secondary | ICD-10-CM

## 2021-06-27 DIAGNOSIS — Z1329 Encounter for screening for other suspected endocrine disorder: Secondary | ICD-10-CM

## 2021-06-27 DIAGNOSIS — Z130 Encounter for screening for diseases of the blood and blood-forming organs and certain disorders involving the immune mechanism: Secondary | ICD-10-CM

## 2021-06-27 DIAGNOSIS — Z13220 Encounter for screening for lipoid disorders: Secondary | ICD-10-CM

## 2021-06-27 DIAGNOSIS — F419 Anxiety disorder, unspecified: Secondary | ICD-10-CM

## 2021-06-27 DIAGNOSIS — Z13228 Encounter for screening for other metabolic disorders: Secondary | ICD-10-CM

## 2021-06-27 DIAGNOSIS — F909 Attention-deficit hyperactivity disorder, unspecified type: Secondary | ICD-10-CM

## 2021-06-27 DIAGNOSIS — Z1159 Encounter for screening for other viral diseases: Secondary | ICD-10-CM

## 2021-06-27 DIAGNOSIS — F319 Bipolar disorder, unspecified: Secondary | ICD-10-CM

## 2021-06-27 DIAGNOSIS — F4310 Post-traumatic stress disorder, unspecified: Secondary | ICD-10-CM

## 2021-06-27 DIAGNOSIS — Z114 Encounter for screening for human immunodeficiency virus [HIV]: Secondary | ICD-10-CM

## 2021-06-27 NOTE — Progress Notes (Signed)
Erroneous encounter

## 2021-07-24 NOTE — Progress Notes (Deleted)
Patient ID: Jesus Bauer, male    DOB: 01-Nov-1985  MRN: [id number]  CC: Annual Physical Exam  Subjective: Jesus Bauer is a 36 y.o. male who presents for annual physical exam.   His concerns today include: ***  There are no problems to display for this patient.    Current Outpatient Medications on File Prior to Visit  Medication Sig Dispense Refill   FLUoxetine (PROZAC) 10 MG capsule Take 1 capsule (10 mg total) by mouth daily. 30 capsule 2   No current facility-administered medications on file prior to visit.    No Known Allergies  Social History   Socioeconomic History   Marital status: Married    Spouse name: Not on file   Number of children: Not on file   Years of education: Not on file   Highest education level: Not on file  Occupational History   Not on file  Tobacco Use   Smoking status: Every Day    Packs/day: 0.50    Types: Cigarettes   Smokeless tobacco: Never  Vaping Use   Vaping Use: Never used  Substance and Sexual Activity   Alcohol use: No   Drug use: No   Sexual activity: Not on file  Other Topics Concern   Not on file  Social History Narrative   Not on file   Social Determinants of Health   Financial Resource Strain: Not on file  Food Insecurity: Not on file  Transportation Needs: Not on file  Physical Activity: Not on file  Stress: Not on file  Social Connections: Not on file  Intimate Partner Violence: Not on file    No family history on file.  Past Surgical History:  Procedure Laterality Date   WRIST SURGERY     WRIST SURGERY      ROS: Review of Systems Negative except as stated above  PHYSICAL EXAM: There were no vitals taken for this visit.  Physical Exam  {male adult master:310786} {male adult master:310785}     Latest Ref Rng & Units 09/28/2016   12:41 PM 05/07/2013    9:19 PM 01/01/2012    7:31 PM  CMP  Glucose 65 - 99 mg/dL 88   742   78    BUN 6 - 20 mg/dL 11   12   8     Creatinine 0.61 - 1.24  mg/dL   5.95   6.38    Sodium 135 - 145 mmol/L 138   144   135    Potassium 3.5 - 5.1 mmol/L 4.0   3.6   4.4    Chloride 101 - 111 mmol/L 106   104   101    CO2 22 - 32 mmol/L 24   23   24     Calcium 8.9 - 10.3 mg/dL 9.4   9.6   9.3    Total Protein 6.5 - 8.1 g/dL 6.9   8.2   7.1    Total Bilirubin 0.3 - 1.2 mg/dL 0.9   0.2   0.3    Alkaline Phos 38 - 126 U/L 51   64   61    AST 15 - 41 U/L 26   41   28    ALT 17 - 63 U/L 18   56   14     Lipid Panel  No results found for: CHOL, TRIG, HDL, CHOLHDL, VLDL, LDLCALC, LDLDIRECT  CBC    Component Value Date/Time   WBC 5.5 09/28/2016 1241  RBC 4.87 09/28/2016 1241   HGB 14.8 09/28/2016 1241   HCT 42.9 09/28/2016 1241   PLT 294 09/28/2016 1241   MCV 88.1 09/28/2016 1241   MCH 30.4 09/28/2016 1241   MCHC 34.5 09/28/2016 1241   RDW 12.7 09/28/2016 1241   LYMPHSABS 3.5 05/07/2013 2119   MONOABS 0.6 05/07/2013 2119   EOSABS 0.1 05/07/2013 2119   BASOSABS 0.0 05/07/2013 2119    ASSESSMENT AND PLAN:  There are no diagnoses linked to this encounter.   Patient was given the opportunity to ask questions.  Patient verbalized understanding of the plan and was able to repeat key elements of the plan. Patient was given clear instructions to go to Emergency Department or return to medical center if symptoms don't improve, worsen, or new problems develop.The patient verbalized understanding.   No orders of the defined types were placed in this encounter.    Requested Prescriptions    No prescriptions requested or ordered in this encounter    No follow-ups on file.  Rema Fendt, NP

## 2021-07-29 ENCOUNTER — Encounter: Payer: Medicaid Other | Admitting: Family

## 2021-07-29 DIAGNOSIS — Z0000 Encounter for general adult medical examination without abnormal findings: Secondary | ICD-10-CM

## 2021-07-29 DIAGNOSIS — Z1329 Encounter for screening for other suspected endocrine disorder: Secondary | ICD-10-CM

## 2021-07-29 DIAGNOSIS — Z1159 Encounter for screening for other viral diseases: Secondary | ICD-10-CM

## 2021-07-29 DIAGNOSIS — Z13220 Encounter for screening for lipoid disorders: Secondary | ICD-10-CM

## 2021-07-29 DIAGNOSIS — Z131 Encounter for screening for diabetes mellitus: Secondary | ICD-10-CM

## 2021-07-29 DIAGNOSIS — Z13228 Encounter for screening for other metabolic disorders: Secondary | ICD-10-CM

## 2021-07-29 DIAGNOSIS — Z130 Encounter for screening for diseases of the blood and blood-forming organs and certain disorders involving the immune mechanism: Secondary | ICD-10-CM

## 2021-08-01 ENCOUNTER — Ambulatory Visit: Payer: Medicaid Other | Admitting: Orthopaedic Surgery

## 2021-08-06 ENCOUNTER — Ambulatory Visit (INDEPENDENT_AMBULATORY_CARE_PROVIDER_SITE_OTHER): Payer: Medicaid Other | Admitting: Licensed Clinical Social Worker

## 2021-08-06 ENCOUNTER — Encounter (HOSPITAL_COMMUNITY): Payer: Self-pay | Admitting: Licensed Clinical Social Worker

## 2021-08-06 DIAGNOSIS — F902 Attention-deficit hyperactivity disorder, combined type: Secondary | ICD-10-CM | POA: Insufficient documentation

## 2021-08-06 DIAGNOSIS — F331 Major depressive disorder, recurrent, moderate: Secondary | ICD-10-CM

## 2021-08-06 DIAGNOSIS — F411 Generalized anxiety disorder: Secondary | ICD-10-CM
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
## 2021-08-15 ENCOUNTER — Encounter: Payer: Self-pay | Admitting: Orthopaedic Surgery

## 2021-08-15 ENCOUNTER — Ambulatory Visit: Payer: Medicaid Other

## 2021-08-15 ENCOUNTER — Ambulatory Visit (INDEPENDENT_AMBULATORY_CARE_PROVIDER_SITE_OTHER): Payer: Medicaid Other | Admitting: Orthopaedic Surgery

## 2021-08-15 VITALS — BP 146/93 | HR 66 | Ht 65.0 in | Wt 217.0 lb

## 2021-08-15 DIAGNOSIS — G8929 Other chronic pain: Secondary | ICD-10-CM | POA: Diagnosis not present

## 2021-08-15 DIAGNOSIS — M545 Low back pain, unspecified: Secondary | ICD-10-CM | POA: Diagnosis not present

## 2021-08-15 NOTE — Progress Notes (Signed)
Office Visit Note   Patient: Jesus Bauer           Date of Birth: 1985-09-18           MRN: [id number] Visit Date: 08/15/2021              Requested by: Rema Fendt, NP [address] [place],  [place] [address] PCP: Rema Fendt, NP   Assessment & Plan: Visit Diagnoses:  1. Chronic bilateral low back pain, unspecified whether sciatica present     Plan: We will set patient up for physical therapy.  Recheck 7 weeks.  CT scan was reviewed as well as report.  Follow-Up Instructions: Return in about 7 weeks (around 10/03/2021).   Orders:  Orders Placed This Encounter  Procedures   XR Lumbar Spine 2-3 Views   No orders of the defined types were placed in this encounter.     Procedures: No procedures performed   Clinical Data: No additional findings.   Subjective: Chief Complaint  Patient presents with   Lower Back - Pain    HPI 36 year old male new patient visit for chronic back pain which is hurting for years.  States he had a CT scan which showed small central disc herniation more prominent than previous scan 01/01/2012.  When asked why he did delayed treatment visit for chronic back pain that has been so severe he states is because he was in prison for parole violation.  He has not had therapy or injections.  He has tried over-the-counter medications without relief.  He has not been through any therapy.  Patient is here with his fiance.  Review of Systems no fever chills no bowel bladder symptoms.  No claudication symptoms.  All other systems are negative.   Objective: Vital Signs: BP (!) 146/93   Pulse 66   Ht 5\' 5"  (1.651 m)   Wt 217 lb (98.4 kg)   BMI 36.11 kg/m   Physical Exam Constitutional:      Appearance: He is well-developed.  HENT:     Head: Normocephalic and atraumatic.     Right Ear: External ear normal.     Left Ear: External ear normal.  Eyes:     Pupils: Pupils are equal, round, and reactive to light.  Neck:      Thyroid: No thyromegaly.     Trachea: No tracheal deviation.  Cardiovascular:     Rate and Rhythm: Normal rate.  Pulmonary:     Effort: Pulmonary effort is normal.     Breath sounds: No wheezing.  Abdominal:     General: Bowel sounds are normal.     Palpations: Abdomen is soft.  Musculoskeletal:     Cervical back: Neck supple.  Skin:    General: Skin is warm and dry.     Capillary Refill: Capillary refill takes less than 2 seconds.  Neurological:     Mental Status: He is alert and oriented to person, place, and time.  Psychiatric:        Behavior: Behavior normal.        Thought Content: Thought content normal.        Judgment: Judgment normal.    Ortho Exam patient has negative straight leg raising is able to heel and toe walk reflexes are 2+ and symmetrical.  Tenderness palpation lumbar spine.  Specialty Comments:  No specialty comments available.  Imaging: No results found.   PMFS History: Patient Active Problem List   Diagnosis Date Noted  Attention deficit hyperactivity disorder (ADHD), combined type 08/06/2021   Major depressive disorder, recurrent episode, moderate (HCC) 08/06/2021   GAD (generalized anxiety disorder) 08/06/2021   Past Medical History:  Diagnosis Date   ADHD (attention deficit hyperactivity disorder)    Anxiety    Bipolar 1 disorder (HCC)    PTSD (post-traumatic stress disorder)     No family history on file.  Past Surgical History:  Procedure Laterality Date   WRIST SURGERY     WRIST SURGERY     Social History   Occupational History   Not on file  Tobacco Use   Smoking status: Every Day    Packs/day: 0.50    Types: Cigarettes   Smokeless tobacco: Never  Vaping Use   Vaping Use: Never used  Substance and Sexual Activity   Alcohol use: No   Drug use: No   Sexual activity: Yes    Partners: Female    Comment: engaged 1 partner

## 2021-08-18 ENCOUNTER — Other Ambulatory Visit: Payer: Self-pay | Admitting: Family

## 2021-08-18 DIAGNOSIS — F32A Depression, unspecified: Secondary | ICD-10-CM

## 2021-08-18 NOTE — Telephone Encounter (Signed)
It appears patient established with Psychiatry. If we can confirm. Refills should come from the same.

## 2021-08-22 ENCOUNTER — Other Ambulatory Visit: Payer: Self-pay | Admitting: Family

## 2021-08-22 DIAGNOSIS — F419 Anxiety disorder, unspecified: Secondary | ICD-10-CM

## 2021-09-02 NOTE — Therapy (Deleted)
OUTPATIENT PHYSICAL THERAPY THORACOLUMBAR EVALUATION   Patient Name: Jesus Bauer MRN: [id number] DOB:21-Dec-1985, 36 y.o., male Today's Date: 09/02/2021    Past Medical History:  Diagnosis Date   ADHD (attention deficit hyperactivity disorder)    Anxiety    Bipolar 1 disorder (HCC)    PTSD (post-traumatic stress disorder)    Past Surgical History:  Procedure Laterality Date   WRIST SURGERY     WRIST SURGERY     Patient Active Problem List   Diagnosis Date Noted   Low back pain 08/15/2021   Attention deficit hyperactivity disorder (ADHD), combined type 08/06/2021   Major depressive disorder, recurrent episode, moderate (HCC) 08/06/2021   GAD (generalized anxiety disorder) 08/06/2021    PCP: Rema Fendt, NP  REFERRING PROVIDER: Eldred Manges, MD  REFERRING DIAG: M54.50,G89.29 (ICD-10-CM) - Chronic bilateral low back pain, unspecified whether sciatica present M54.50,G89.29 (ICD-10-CM) - Chronic bilateral low back pain, unspecified whether sciatica present   Rationale for Evaluation and Treatment Rehabilitation  THERAPY DIAG:  Low back pain, chronic small L5-S1 disc protrusion   ONSET DATE: 08/15/2021   SUBJECTIVE:                                                                                                                                                                                           SUBJECTIVE STATEMENT: *** PERTINENT HISTORY:  HPI 36 year old male new patient visit for chronic back pain which is hurting for years.  States he had a CT scan which showed small central disc herniation more prominent than previous scan 01/01/2012.  When asked why he did delayed treatment visit for chronic back pain that has been so severe he states is because he was in prison for parole violation.  He has not had therapy or injections.  He has tried over-the-counter medications without relief.  He has not been through any therapy.  Patient is here with his fiance.    Review of Systems no fever chills no bowel bladder symptoms.  No claudication symptoms.  All other systems are negative.  PAIN:  Are you having pain? Yes: {yespain:27235::"NPRS scale: ***/10","Pain location: ***","Pain description: ***","Aggravating factors: ***","Relieving factors: ***"}   PRECAUTIONS: None  WEIGHT BEARING RESTRICTIONS No  FALLS:  Has patient fallen in last 6 months? No  LIVING ENVIRONMENT: Lives with: {[facility] lives with:25569::"lives with their family"} Lives in: {Lives in:25570} Stairs: {opstairs:27293} Has following equipment at home: {Assistive devices:23999}  OCCUPATION: ***  PLOF: Independent  PATIENT GOALS ***   OBJECTIVE:   DIAGNOSTIC FINDINGS:  Low back pain, chronic small L5-S1 disc protrusion   PATIENT SURVEYS:  Modified Oswestry ***   SCREENING  FOR RED FLAGS: Bowel or bladder incontinence: No  COGNITION:  Overall cognitive status: Within functional limits for tasks assessed     SENSATION: Not tested  MUSCLE LENGTH: Hamstrings: Right *** deg; Left *** deg Maisie Fus test: Right *** deg; Left *** deg  POSTURE: {posture:25561}  PALPATION: ***  LUMBAR ROM:   {AROM/PROM:27142}  A/PROM  eval  Flexion   Extension   Right lateral flexion   Left lateral flexion   Right rotation   Left rotation    (Blank rows = not tested)  LOWER EXTREMITY ROM:     {AROM/PROM:27142}  Right eval Left eval  Hip flexion    Hip extension    Hip abduction    Hip adduction    Hip internal rotation    Hip external rotation    Knee flexion    Knee extension    Ankle dorsiflexion    Ankle plantarflexion    Ankle inversion    Ankle eversion     (Blank rows = not tested)  LOWER EXTREMITY MMT:    MMT Right eval Left eval  Hip flexion    Hip extension    Hip abduction    Hip adduction    Hip internal rotation    Hip external rotation    Knee flexion    Knee extension    Ankle dorsiflexion    Ankle plantarflexion    Ankle inversion     Ankle eversion     (Blank rows = not tested)  LUMBAR SPECIAL TESTS:  {lumbar special test:25242}  FUNCTIONAL TESTS:  {Functional tests:24029}  GAIT: Distance walked: *** Assistive device utilized: {Assistive devices:23999} Level of assistance: {Levels of assistance:24026} Comments: ***    TODAY'S TREATMENT  ***   PATIENT EDUCATION:  Education details: Discussed eval findings, rehab rationale and POC and patient is in agreement  Person educated: {Person educated:25204} Education method: {Education Method:25205} Education comprehension: {Education Comprehension:25206}   HOME EXERCISE PROGRAM: ***  ASSESSMENT:  CLINICAL IMPRESSION: Patient is a *** y.o. *** who was seen today for physical therapy evaluation and treatment for ***.    OBJECTIVE IMPAIRMENTS {opptimpairments:25111}.   ACTIVITY LIMITATIONS {activitylimitations:27494}  PARTICIPATION LIMITATIONS: {participationrestrictions:25113}  PERSONAL FACTORS {Personal factors:25162} are also affecting patient's functional outcome.   REHAB POTENTIAL: {rehabpotential:25112}  CLINICAL DECISION MAKING: {clinical decision making:25114}  EVALUATION COMPLEXITY: {Evaluation complexity:25115}   GOALS: Goals reviewed with patient? {yes/no:20286}  SHORT TERM GOALS: Target date: {follow up:25551}  *** Baseline: Goal status: {GOALSTATUS:25110}  2.  *** Baseline:  Goal status: {GOALSTATUS:25110}  3.  *** Baseline:  Goal status: {GOALSTATUS:25110}  4.  *** Baseline:  Goal status: {GOALSTATUS:25110}  5.  *** Baseline:  Goal status: {GOALSTATUS:25110}  6.  *** Baseline:  Goal status: {GOALSTATUS:25110}  LONG TERM GOALS: Target date: {follow up:25551}  *** Baseline:  Goal status: {GOALSTATUS:25110}  2.  *** Baseline:  Goal status: {GOALSTATUS:25110}  3.  *** Baseline:  Goal status: {GOALSTATUS:25110}  4.  *** Baseline:  Goal status: {GOALSTATUS:25110}  5.  *** Baseline:  Goal status:  {GOALSTATUS:25110}  6.  *** Baseline:  Goal status: {GOALSTATUS:25110}   PLAN: PT FREQUENCY: {rehab frequency:25116}  PT DURATION: {rehab duration:25117}  PLANNED INTERVENTIONS: {rehab planned interventions:25118::"Therapeutic exercises","Therapeutic activity","Neuromuscular re-education","Balance training","Gait training","Patient/Family education","Joint mobilization"}.  PLAN FOR NEXT SESSION: ***   Hildred Laser, PT 09/02/2021, 2:19 PM

## 2021-09-03 ENCOUNTER — Ambulatory Visit: Payer: Medicaid Other | Attending: Orthopaedic Surgery

## 2021-09-19 ENCOUNTER — Ambulatory Visit: Payer: Medicaid Other | Admitting: Orthopaedic Surgery

## 2021-09-26 ENCOUNTER — Ambulatory Visit: Payer: Medicaid Other | Admitting: Orthopaedic Surgery

## 2021-09-29 ENCOUNTER — Other Ambulatory Visit: Payer: Self-pay | Admitting: Family

## 2021-09-29 DIAGNOSIS — F419 Anxiety disorder, unspecified: Secondary | ICD-10-CM

## 2021-09-29 DIAGNOSIS — F32A Depression, unspecified: Secondary | ICD-10-CM

## 2021-09-29 NOTE — Progress Notes (Signed)
Erroneous encounter-disregard

## 2021-10-01 NOTE — Telephone Encounter (Signed)
Patient established with Psychiatry. Request refills from the same. Upcoming appointment 10/08/2021 with Sable Feil, PA.

## 2021-10-03 ENCOUNTER — Encounter: Payer: Medicaid Other | Admitting: Family

## 2021-10-06 ENCOUNTER — Encounter: Payer: Medicaid Other | Admitting: Family

## 2021-10-06 DIAGNOSIS — Z1329 Encounter for screening for other suspected endocrine disorder: Secondary | ICD-10-CM

## 2021-10-06 DIAGNOSIS — Z130 Encounter for screening for diseases of the blood and blood-forming organs and certain disorders involving the immune mechanism: Secondary | ICD-10-CM

## 2021-10-06 DIAGNOSIS — Z0000 Encounter for general adult medical examination without abnormal findings: Secondary | ICD-10-CM

## 2021-10-06 DIAGNOSIS — Z1159 Encounter for screening for other viral diseases: Secondary | ICD-10-CM

## 2021-10-06 DIAGNOSIS — Z114 Encounter for screening for human immunodeficiency virus [HIV]: Secondary | ICD-10-CM

## 2021-10-06 DIAGNOSIS — Z13228 Encounter for screening for other metabolic disorders: Secondary | ICD-10-CM

## 2021-10-06 DIAGNOSIS — Z131 Encounter for screening for diabetes mellitus: Secondary | ICD-10-CM

## 2021-10-06 DIAGNOSIS — Z13220 Encounter for screening for lipoid disorders: Secondary | ICD-10-CM

## 2021-10-08 ENCOUNTER — Encounter (HOSPITAL_COMMUNITY): Payer: Self-pay

## 2021-10-08 ENCOUNTER — Ambulatory Visit (HOSPITAL_COMMUNITY): Payer: Medicaid Other | Admitting: Psychiatry

## 2021-10-22 ENCOUNTER — Ambulatory Visit (INDEPENDENT_AMBULATORY_CARE_PROVIDER_SITE_OTHER): Payer: Medicaid Other | Admitting: Psychiatry

## 2021-10-22 VITALS — BP 170/100 | HR 74 | Wt 217.0 lb

## 2021-10-22 DIAGNOSIS — F331 Major depressive disorder, recurrent, moderate: Secondary | ICD-10-CM | POA: Diagnosis not present

## 2021-10-22 DIAGNOSIS — F411 Generalized anxiety disorder: Secondary | ICD-10-CM | POA: Diagnosis not present

## 2021-10-22 DIAGNOSIS — F3341 Major depressive disorder, recurrent, in partial remission: Secondary | ICD-10-CM | POA: Diagnosis not present

## 2021-10-22 DIAGNOSIS — F902 Attention-deficit hyperactivity disorder, combined type: Secondary | ICD-10-CM | POA: Diagnosis not present

## 2021-10-22 MED ORDER — TRAZODONE HCL 50 MG PO TABS: 50.0000 mg | ORAL_TABLET | Freq: Every evening | ORAL | 2 refills | Status: DC | PRN

## 2021-10-22 MED ORDER — HYDROXYZINE PAMOATE 25 MG PO CAPS: 25.0000 mg | ORAL_CAPSULE | Freq: Three times a day (TID) | ORAL | 2 refills | Status: DC | PRN

## 2021-10-22 MED ORDER — FLUOXETINE HCL 20 MG PO CAPS: 20.0000 mg | ORAL_CAPSULE | Freq: Every day | ORAL | 2 refills | Status: DC

## 2021-10-22 NOTE — Progress Notes (Cosign Needed Addendum)
Psychiatric Initial Adult Assessment   Patient Identification: Jesus Bauer MRN:  [id number] Date of Evaluation:  10/22/2021 Referral Source: PCP Chief Complaint:  No chief complaint on file.  Visit Diagnosis:    ICD-10-CM   1. MDD (major depressive disorder), recurrent, in partial remission (HCC)  F33.41     2. Attention deficit hyperactivity disorder (ADHD), combined type  F90.2     3. Major depressive disorder, recurrent episode, moderate (HCC)  F33.1 FLUoxetine (PROZAC) 20 MG capsule    hydrOXYzine (VISTARIL) 25 MG capsule    traZODone (DESYREL) 50 MG tablet    4. GAD (generalized anxiety disorder)  F41.1       History of Present Illness: Patient is a 36 year old male with past psychiatric history of MDD, GAD, ADHD, PTSD presented to [facility] outpatient clinic for medication management.   Patient states he had been on Prozac 10 mg daily and was not able to fill it last month.  He has not been off Prozac for 2-3 weeks and has been feeling depressed and anxious. He wants to restart his medications. He reports multiple stressors including not being able to do enough for his fiance, unable to see his family, and currently trying to get back on SSI.  He was on disability when he was in prison.  He reports that he went to prison for 3 years due to failure to register his address on sexual offender registry because he was homeless.  He came back on Aug 21, 2020.  He reports that he is innocent and took the fall for someone else.  He does not feel guilty and does not want to talk about it now.  He reports that his mom and grandmother lives in [place] , [place] but he does not talk to her every day. He endorses depressed mood, poor and disturbed sleep and poor concentration.  He denies anhedonia, fatigue,  low energy, hopelessness, helplessness, worthlessness, feeling guilty, memory issues and problems with appetite. He denies any manic symptoms or episode including pressured speech,  hypersexuality, increased spending, racing thoughts, flight of ideas and grandiosity.  He states that he feels irritable and gets on his fiance's nerves. Currently, He denies active or passive Suicidal ideations, Homicidal ideations, auditory and visual hallucinations. He denies any paranoia.  He denies any history of physical, verbal, and sexual abuse. He denies nightmares and flashbacks. He reports generalized anxiety and is worried about everything.  He denies access to guns.  Patient wants to continue Prozac and asking for trazodone for sleep and something for anxiety.  Discussed increasing dose of Prozac for depression, starting hydroxyzine for anxiety and trazodone for insomnia. Recommended pt to follow-up with PCP for high blood pressure and blood work.  Patient agrees with the plan. PHQ 9 scored at 10 and GAD-7 scored at 19.  Past Psychiatric Hx:  Previous Psych Diagnoses: ADHD, MDD, PTSD, GAD Prior inpatient treatment: At [facility] in 2001. Current meds: Prozac 10 mg.  Has not been taking 2-3 weeks. Psychotherapy hx: None Previous suicidal attempts: Denies Previous medication trials: Hydroxyzine, trazodone, Strattera, Rexulti, Latuda Current therapist: None Substance Abuse Hx: Alcohol: Denies Tobacco:Denies Illicit drugs-Denies Rehab JM:[facility] Past Medical History: Medical Diagnoses: Denies Home HD:QQIWLN Allergies:Denies PCP: Dr. Delfin Gant  Associated Signs/Symptoms: Depression Symptoms:  insomnia, difficulty concentrating, anxiety, (Hypo) Manic Symptoms:  Distractibility, Irritable Mood, High energy Anxiety Symptoms:  Excessive Worry, Psychotic Symptoms:   none PTSD Symptoms: Had a traumatic exposure:  Went to prison for 3 years Re-experiencing:  None  Past Psychiatric History:   Previous Psychotropic Medications: Yes   Substance Abuse History in the last 12 months:  No.  Consequences of Substance Abuse: NA  Past Medical History:  Past Medical History:   Diagnosis Date   ADHD (attention deficit hyperactivity disorder)    Anxiety    Bipolar 1 disorder (HCC)    PTSD (post-traumatic stress disorder)     Past Surgical History:  Procedure Laterality Date   WRIST SURGERY     WRIST SURGERY      Family Psychiatric History:  Mom-bipolar No SA in family No substance abuse problem  Family History: No family history on file.  Social History:   Social History   Socioeconomic History   Marital status: Married    Spouse name: Not on file   Number of children: Not on file   Years of education: Not on file   Highest education level: Not on file  Occupational History   Not on file  Tobacco Use   Smoking status: Every Day    Packs/day: 0.50    Types: Cigarettes   Smokeless tobacco: Never  Vaping Use   Vaping Use: Never used  Substance and Sexual Activity   Alcohol use: No   Drug use: No   Sexual activity: Yes    Partners: Female    Comment: engaged 1 partner  Other Topics Concern   Not on file  Social History Narrative   Not on file   Social Determinants of Health   Financial Resource Strain: High Risk (08/06/2021)   Overall Financial Resource Strain (CARDIA)    Difficulty of Paying Living Expenses: Hard  Food Insecurity: No Food Insecurity (08/06/2021)   Hunger Vital Sign    Worried About Running Out of Food in the Last Year: Never true    Ran Out of Food in the Last Year: Never true  Transportation Needs: Unmet Transportation Needs (08/06/2021)   PRAPARE - Transportation    Lack of Transportation (Medical): Yes    Lack of Transportation (Non-Medical): Yes  Physical Activity: Sufficiently Active (08/06/2021)   Exercise Vital Sign    Days of Exercise per Week: 7 days    Minutes of Exercise per Session: 30 min  Stress: Stress Concern Present (08/06/2021)   [employer] of Occupational Health - Occupational Stress Questionnaire    Feeling of Stress : Rather much  Social Connections: Socially Isolated (08/06/2021)    Social Connection and Isolation Panel [NHANES]    Frequency of Communication with Friends and Family: Twice a week    Frequency of Social Gatherings with Friends and Family: Never    Attends Religious Services: Never    Database administrator or Organizations: No    Attends Banker Meetings: Never    Marital Status: Living with partner    Additional Social History: He lives with his fiance in a hotel in [place].  No kids.  He is unemployed and studied until 10th grade.  Allergies:  No Known Allergies  Metabolic Disorder Labs: No results found for: "HGBA1C", "MPG" No results found for: "PROLACTIN" No results found for: "CHOL", "TRIG", "HDL", "CHOLHDL", "VLDL", "LDLCALC" No results found for: "TSH"  Therapeutic Level Labs: No results found for: "LITHIUM" No results found for: "CBMZ" No results found for: "VALPROATE"  Current Medications: Current Outpatient Medications  Medication Sig Dispense Refill   FLUoxetine (PROZAC) 20 MG capsule Take 1 capsule (20 mg total) by mouth daily. 30 capsule 2   hydrOXYzine (VISTARIL) 25 MG capsule Take  1 capsule (25 mg total) by mouth 3 (three) times daily as needed for anxiety. 90 capsule 2   traZODone (DESYREL) 50 MG tablet Take 1 tablet (50 mg total) by mouth at bedtime as needed for sleep. 50 tablet 2   FLUoxetine (PROZAC) 10 MG capsule Take 1 capsule (10 mg total) by mouth daily. 30 capsule 2   No current facility-administered medications for this visit.    Musculoskeletal: Strength & Muscle Tone: within normal limits Gait & Station: normal Patient leans: N/A  Psychiatric Specialty Exam: Review of Systems  Blood pressure (!) 170/100, pulse 74, weight 217 lb (98.4 kg), SpO2 100 %.Body mass index is 36.11 kg/m.  General Appearance: Casual and Neat  Eye Contact:  Good  Speech:  Clear and Coherent and Normal Rate  Volume:  Normal  Mood:  Anxious  Affect:  Full Range  Thought Process:  Coherent and Goal Directed   Orientation:  Full (Time, Place, and Person)  Thought Content:  Logical  Suicidal Thoughts:  No  Homicidal Thoughts:  No  Memory:  Immediate;   Good Recent;   Good Remote;   Good  Judgement:  Fair  Insight:  Fair  Psychomotor Activity:  Restlessness  Concentration:  Concentration: Fair and Attention Span: Fair  Recall:  Good  Fund of Knowledge:Good  Language: Good  Akathisia:  No  Handed:  Right  AIMS (if indicated):  not done  Assets:  Communication Skills Desire for Improvement Social Support  ADL's:  Intact  Cognition: WNL  Sleep:  Poor   Screenings: GAD-7    Loss adjuster, chartered Office Visit from 10/22/2021 in [facility] Counselor from 08/06/2021 in [facility]  Total GAD-7 Score 19 14      PHQ2-9    Flowsheet Row Office Visit from 10/22/2021 in [facility] Counselor from 08/06/2021 in [facility] Office Visit from 05/20/2021 in Primary Care at [facility]  PHQ-2 Total Score 3 3 5   PHQ-9 Total Score 10 15 13       Flowsheet Row Counselor from 08/06/2021 in [facility]  C-SSRS RISK CATEGORY No Risk       Assessment and Plan:Patient is a 36 year old male with past psychiatric history of MDD, GAD, ADHD, PTSD presented to [facility] outpatient clinic for medication management.  Patient had been on Prozac and did well.  He he has been off medication for 2-3 weeks and wants refill.  He is reporting severe anxiety and insomnia.  Will increase Prozac to 20 mg daily for depression and start trazodone for insomnia and hydroxyzine for anxiety. PHQ 9 scored at 10 and GAD-7 scored at 19. MDD, recurrent, in partial remission Insomnia  -Increase Prozac to 20 mg daily.  30-day prescription with 2 refills sent to patient pharmacy -Start trazodone 50 mg as needed nightly for insomnia.   GAD -Start hydroxyzine 25 mg 3 times daily as needed for  anxiety  High blood pressure -Recommended to follow-up with PCP for high blood pressure and blood work (CBC, CMP, lipid panel, HbA1c, TSH)  Follow-up-6 weeks Collaboration of Care: Primary Care Provider AEB PCP in EPIC   Patient/Guardian was advised Release of Information must be obtained prior to any record release in order to collaborate their care with an outside provider. Patient/Guardian was advised if they have not already done so to contact the registration department to sign all necessary forms in order for [facility] to release information regarding  their care.   Consent: Patient/Guardian gives verbal consent for treatment and assignment of benefits for services provided during this visit. Patient/Guardian expressed understanding and agreed to proceed.   Karsten Ro, MD PGY 3 7/26/20232:09 PM

## 2021-10-22 NOTE — Patient Instructions (Signed)
Follow-up in 6 weeks

## 2021-10-24 ENCOUNTER — Encounter (HOSPITAL_COMMUNITY): Payer: Self-pay | Admitting: Psychiatry

## 2021-10-28 ENCOUNTER — Ambulatory Visit: Payer: Self-pay

## 2021-10-28 ENCOUNTER — Emergency Department (HOSPITAL_COMMUNITY)
Admission: EM | Admit: 2021-10-28 | Discharge: 2021-10-29 | Disposition: A | Discharge status: Home / Self Care | Payer: Medicaid Other | Attending: Emergency Medicine | Admitting: Emergency Medicine

## 2021-10-28 ENCOUNTER — Other Ambulatory Visit: Payer: Self-pay

## 2021-10-28 DIAGNOSIS — R0789 Other chest pain: Secondary | ICD-10-CM | POA: Diagnosis not present

## 2021-10-28 DIAGNOSIS — R079 Chest pain, unspecified: Secondary | ICD-10-CM | POA: Insufficient documentation

## 2021-10-28 NOTE — ED Triage Notes (Signed)
Pt here for sharp L side intermittent chest pain x4 days. Pt reports pain comes and goes, states when it happens he  can "mash" on his chest and it goes away. Pt denies shob, N/V, diaphoresis.

## 2021-10-28 NOTE — Telephone Encounter (Signed)
Pt should report to ED for chest pains

## 2021-10-28 NOTE — Telephone Encounter (Signed)
  Chief Complaint: Hypertension/ intermittent chest pain Symptoms: Chest pain BP of 170/100 July 26th Frequency: Chest pains yesterday. Pertinent Negatives: Patient denies sob Disposition: [x] ED /[] Urgent Care (no appt availability in office) / [] Appointment(In office/virtual)/ []  [facility]/ [] Home Care/ [] Refused Recommended Disposition /[] [employer] [facility]/ []  Follow-up with PCP Additional Notes: Pt was seen at [facility] on July 26th where his BP was found to be 170/100 manual reading by provider. Pt was advise to follow up with PCP. During call today, pt states that he has been having intermittent chest pain while walking since yesterday. Reason for Disposition  [1] Systolic BP  >= 160 OR Diastolic >= 100 AND [2] cardiac (e.g., breathing difficulty, chest pain) or neurologic symptoms (e.g., new-onset blurred or double vision, unsteady gait)  Answer Assessment - Initial Assessment Questions 1. BLOOD PRESSURE: "What is the blood pressure?" "Did you take at least two measurements 5 minutes apart?"     170/100 on July 26, 2. ONSET: "When did you take your blood pressure?"     July 26th 3. HOW: "How did you take your blood pressure?" (e.g., automatic home BP monitor, visiting nurse)     Doctor 4. HISTORY: "Do you have a history of high blood pressure?"     no 5. MEDICINES: "Are you taking any medicines for blood pressure?" "Have you missed any doses recently?"     no 6. OTHER SYMPTOMS: "Do you have any symptoms?" (e.g., blurred vision, chest pain, difficulty breathing, headache, weakness)     Chest pain 7. PREGNANCY: "Is there any chance you are pregnant?" "When was your last menstrual period?"     na  Protocols used: Blood Pressure - High-A-AH

## 2021-10-29 ENCOUNTER — Emergency Department (HOSPITAL_COMMUNITY): Payer: Medicaid Other

## 2021-10-29 DIAGNOSIS — R079 Chest pain, unspecified: Secondary | ICD-10-CM | POA: Diagnosis not present

## 2021-10-29 LAB — BASIC METABOLIC PANEL
Anion gap: 10 (ref 5–15)
BUN: 11 mg/dL (ref 6–20)
CO2: 25 mmol/L (ref 22–32)
Calcium: 9.7 mg/dL (ref 8.9–10.3)
Chloride: 105 mmol/L (ref 98–111)
Creatinine, Ser: 0.68 mg/dL (ref 0.61–1.24)
GFR, Estimated: >60 mL/min (ref >60)
Glucose, Bld: 95 mg/dL (ref 70–99)
Potassium: 3.5 mmol/L (ref 3.5–5.1)
Sodium: 140 mmol/L (ref 135–145)

## 2021-10-29 LAB — CBC
HCT: 44 % (ref 39.0–52.0)
Hemoglobin: 15.4 g/dL (ref 13.0–17.0)
MCH: 30.7 pg (ref 26.0–34.0)
MCHC: 35 g/dL (ref 30.0–36.0)
MCV: 87.8 fL (ref 80.0–100.0)
Platelets: 355 10*3/uL (ref 150–400)
RBC: 5.01 MIL/uL (ref 4.22–5.81)
RDW: 12.5 % (ref 11.5–15.5)
WBC: 11.5 10*3/uL — ABNORMAL HIGH (ref 4.0–10.5)
nRBC: 0 % (ref 0.0–0.2)

## 2021-10-29 LAB — TROPONIN I (HIGH SENSITIVITY)
Troponin I (High Sensitivity): 5 ng/L (ref <18)
Troponin I (High Sensitivity): 6 ng/L (ref <18)

## 2021-10-29 NOTE — ED Provider Notes (Signed)
[facility] EMERGENCY DEPARTMENT Provider Note   CSN: [id number] Arrival date & time: 10/28/21  2302     History  Chief Complaint  Patient presents with   Chest Pain    Jesus Bauer is a 36 y.o. male.  The history is provided by the patient and medical records.  Chest Pain  36 year old male with history of ADHD, anxiety, depression, bipolar disorder, presenting to the ED with chest pain.  States has been having intermittent left-sided chest pain for the past 4 days.  States when this occurs it is sharp and stabbing in nature, only lasting a few seconds before resolving.  He seems to notice pain more when up and moving about.  He denies any associated shortness of breath, diaphoresis, nausea, vomiting.  No pain into the neck or left arm.  He denies any known cardiac history but does have extensive family cardiac history.  He is a smoker.  He has no history of DVT or PE.  No recent travel or prolonged immobilization.  No recent trauma or surgery.  Home Medications Prior to Admission medications   Medication Sig Start Date End Date Taking? Authorizing Provider  FLUoxetine (PROZAC) 10 MG capsule Take 1 capsule (10 mg total) by mouth daily. 06/10/21 09/08/21  Rema Fendt, NP  FLUoxetine (PROZAC) 20 MG capsule Take 1 capsule (20 mg total) by mouth daily. 10/22/21   Karsten Ro, MD  hydrOXYzine (VISTARIL) 25 MG capsule Take 1 capsule (25 mg total) by mouth 3 (three) times daily as needed for anxiety. 10/22/21   Karsten Ro, MD  traZODone (DESYREL) 50 MG tablet Take 1 tablet (50 mg total) by mouth at bedtime as needed for sleep. 10/22/21   Karsten Ro, MD      Allergies    Patient has no known allergies.    Review of Systems   Review of Systems  Cardiovascular:  Positive for chest pain.  All other systems reviewed and are negative.   Physical Exam Updated Vital Signs BP (!) 152/89   Pulse 62   Temp 98.1 F (36.7 C) (Oral)   Resp 20   SpO2 98%    Physical Exam Vitals and nursing note reviewed.  Constitutional:      Appearance: He is well-developed.  HENT:     Head: Normocephalic and atraumatic.  Eyes:     Conjunctiva/sclera: Conjunctivae normal.     Pupils: Pupils are equal, round, and reactive to light.  Cardiovascular:     Rate and Rhythm: Normal rate and regular rhythm.     Heart sounds: Normal heart sounds.  Pulmonary:     Effort: Pulmonary effort is normal.     Breath sounds: Normal breath sounds.  Abdominal:     General: Bowel sounds are normal.     Palpations: Abdomen is soft.  Musculoskeletal:        General: Normal range of motion.     Cervical back: Normal range of motion.  Skin:    General: Skin is warm and dry.  Neurological:     Mental Status: He is alert and oriented to person, place, and time.     ED Results / Procedures / Treatments   Labs (all labs ordered are listed, but only abnormal results are displayed) Labs Reviewed  CBC - Abnormal; Notable for the following components:      Result Value   WBC 11.5 (*)    All other components within normal limits  BASIC METABOLIC PANEL  TROPONIN I (  HIGH SENSITIVITY)  TROPONIN I (HIGH SENSITIVITY)    EKG None  Radiology DG Chest 2 View  Result Date: 10/29/2021 CLINICAL DATA:  Chest pain. EXAM: CHEST - 2 VIEW COMPARISON:  Chest radiograph dated 04/12/2017. FINDINGS: The heart size and mediastinal contours are within normal limits. Both lungs are clear. The visualized skeletal structures are unremarkable. IMPRESSION: No active cardiopulmonary disease. Electronically Signed   By: Elgie Collard M.D.   On: 10/29/2021 00:19    Procedures Procedures    Medications Ordered in ED Medications - No data to display  ED Course/ Medical Decision Making/ A&P                           Medical Decision Making  36 y.o. M here with chest pain, intermittent x 4 days.  No associated symptoms.  Does have family cardiac hx, he is a smoker.  EKG without acute  ischemic changes.  Initial labs reassuring without anemia or electrolyte derangement.  Troponin is negative.  CXR is clear.   Given strong family cardiac hx, will obtain delta troponin.  Delta trop remains negative.  VS remain stable.  No risk factors for PE, PERC negative.  Symptoms sounds atypical, doubt ACS, PE, dissection, or other acute cardiac event.  Stable for discharge, follow-up with PCP encouraged.  Return here for new concerns.  Final Clinical Impression(s) / ED Diagnoses Final diagnoses:  Chest pain in adult    Rx / DC Orders ED Discharge Orders     None         Garlon Hatchet, PA-C 10/29/21 7510    Pollyann Savoy, MD 10/29/21 [phone]

## 2021-10-29 NOTE — Discharge Instructions (Signed)
Cardiac tests today were normal. We do recommend that you follow-up with your primary care doctor. Return here for new concerns.

## 2021-10-29 NOTE — ED Notes (Signed)
Patient verbalizes understanding of discharge instructions. Opportunity for questioning and answers were provided. Armband removed by staff, pt discharged from ED. Pt ambulatory to ED waiting room with steady gait.  

## 2021-10-31 ENCOUNTER — Telehealth: Payer: Self-pay

## 2021-10-31 NOTE — Telephone Encounter (Signed)
Transition Care Management Unsuccessful Follow-up Telephone Call  Date of discharge and from where:  10/29/2021 from [facility]  Attempts:  1st Attempt  Reason for unsuccessful TCM follow-up call:  Left voice message

## 2021-11-05 NOTE — Telephone Encounter (Signed)
Transition Care Management Unsuccessful Follow-up Telephone Call  Date of discharge and from where:  10/29/2021 from [facility]  Attempts:  2nd Attempt  Reason for unsuccessful TCM follow-up call:  Left voice message

## 2021-11-07 NOTE — Telephone Encounter (Signed)
Transition Care Management Follow-up Telephone Call Date of discharge and from where: 10/29/2021 from [facility] How have you been since you were released from the hospital? Patient stated that he is feeling well and did not have any questions at this time. Patient did mention that he feeling like he pulled a muscle in his upper chest/neck area. PCP follow up encouraged.  Any questions or concerns? No  Items Reviewed: Did the pt receive and understand the discharge instructions provided? Yes  Medications obtained and verified? Yes  Other? No  Any new allergies since your discharge? No  Dietary orders reviewed? No Do you have support at home? Yes   Functional Questionnaire: (I = Independent and D = Dependent) ADLs: I  Bathing/Dressing- I  Meal Prep- I  Eating- I  Maintaining continence- I  Transferring/Ambulation- I  Managing Meds- I   Follow up appointments reviewed:  PCP Hospital f/u appt confirmed? No  Encourage follow up with PCP to evaluate pulled muscle.  Specialist Hospital f/u appt confirmed? No   Are transportation arrangements needed? No  If their condition worsens, is the pt aware to call PCP or go to the Emergency Dept.? Yes Was the patient provided with contact information for the PCP's office or ED? Yes Was to pt encouraged to call back with questions or concerns? Yes

## 2021-11-11 NOTE — Progress Notes (Signed)
Erroneous encounter-disregard

## 2021-11-20 ENCOUNTER — Encounter: Payer: Self-pay | Admitting: Family

## 2021-11-20 ENCOUNTER — Encounter: Payer: Medicaid Other | Admitting: Family

## 2021-11-20 DIAGNOSIS — R079 Chest pain, unspecified: Secondary | ICD-10-CM

## 2021-12-05 ENCOUNTER — Encounter (HOSPITAL_COMMUNITY): Payer: Medicaid Other | Admitting: Psychiatry

## 2022-01-13 ENCOUNTER — Ambulatory Visit: Payer: Medicaid Other | Admitting: Family

## 2022-01-27 ENCOUNTER — Encounter: Payer: Medicaid Other | Admitting: Family

## 2022-01-27 NOTE — Progress Notes (Signed)
Erroneous encounter-disregard

## 2022-03-05 ENCOUNTER — Other Ambulatory Visit: Payer: Self-pay | Admitting: Family

## 2022-03-05 DIAGNOSIS — F331 Major depressive disorder, recurrent, moderate: Secondary | ICD-10-CM

## 2022-03-05 NOTE — Telephone Encounter (Signed)
Medication Refill - Medication:  FLUoxetine (PROZAC) 20 MG capsule  hydrOXYzine (VISTARIL) 25 MG capsule   Has the patient contacted their pharmacy? Yes.   (Agent: If no, request that the patient contact the pharmacy for the refill. If patient does not wish to contact the pharmacy document the reason why and proceed with request.) (Agent: If yes, when and what did the pharmacy advise?)  Preferred Pharmacy (with phone number or street name):  [facility] - [place], [place] - [address] Phone: [phone]  Fax: [phone]     Has the patient been seen for an appointment in the last year OR does the patient have an upcoming appointment? Yes.    Agent: Please be advised that RX refills may take up to 3 business days. We ask that you follow-up with your pharmacy.

## 2022-03-05 NOTE — Telephone Encounter (Signed)
Prescribed by Dr. Karsten Ro- prescriber not at this office Requested Prescriptions  Refused Prescriptions Disp Refills   hydrOXYzine (VISTARIL) 25 MG capsule 90 capsule 2    Sig: Take 1 capsule (25 mg total) by mouth 3 (three) times daily as needed for anxiety.     Ear, Nose, and Throat:  Antihistamines 2 Passed - 03/05/2022  3:32 PM      Passed - Cr in normal range and within 360 days    Creatinine, Ser  Date Value Ref Range Status  10/28/2021 0.68 0.61 - 1.24 mg/dL Final         Passed - Valid encounter within last 12 months    Recent Outpatient Visits           9 months ago Encounter to establish care   Primary Care at [facility] [address], Amy J, NP       Future Appointments             In 5 days Rema Fendt, NP Primary Care at [facility]             FLUoxetine (PROZAC) 20 MG capsule 30 capsule 2    Sig: Take 1 capsule (20 mg total) by mouth daily.     Psychiatry:  Antidepressants - SSRI Failed - 03/05/2022  3:32 PM      Failed - Valid encounter within last 6 months    Recent Outpatient Visits           9 months ago Encounter to establish care   Primary Care at [facility], Salomon Fick, NP       Future Appointments             In 5 days Rema Fendt, NP Primary Care at [facility] - Completed PHQ-2 or PHQ-9 in the last 360 days

## 2022-03-06 ENCOUNTER — Telehealth (HOSPITAL_COMMUNITY): Payer: Self-pay

## 2022-03-06 ENCOUNTER — Other Ambulatory Visit (HOSPITAL_COMMUNITY): Payer: Self-pay | Admitting: Psychiatry

## 2022-03-06 DIAGNOSIS — F331 Major depressive disorder, recurrent, moderate: Secondary | ICD-10-CM

## 2022-03-06 MED ORDER — HYDROXYZINE PAMOATE 25 MG PO CAPS: 25.0000 mg | ORAL_CAPSULE | Freq: Three times a day (TID) | ORAL | 0 refills | Status: DC | PRN

## 2022-03-06 MED ORDER — FLUOXETINE HCL 20 MG PO CAPS: 20.0000 mg | ORAL_CAPSULE | Freq: Every day | ORAL | 0 refills | Status: DC

## 2022-03-09 NOTE — Progress Notes (Deleted)
Patient ID: Jesus Bauer, male    DOB: June 08, 1985  MRN: [id number]  CC: No chief complaint on file.   Subjective: Jesus Bauer is a 36 y.o. male who presents for  His concerns today include:  Estab with Psych   Unsure today's CC  Patient Active Problem List   Diagnosis Date Noted   Low back pain 08/15/2021   Attention deficit hyperactivity disorder (ADHD), combined type 08/06/2021   Major depressive disorder, recurrent episode, moderate (HCC) 08/06/2021   GAD (generalized anxiety disorder) 08/06/2021     Current Outpatient Medications on File Prior to Visit  Medication Sig Dispense Refill   FLUoxetine (PROZAC) 10 MG capsule Take 1 capsule (10 mg total) by mouth daily. 30 capsule 2   FLUoxetine (PROZAC) 20 MG capsule Take 1 capsule (20 mg total) by mouth daily. 30 capsule 0   hydrOXYzine (VISTARIL) 25 MG capsule Take 1 capsule (25 mg total) by mouth 3 (three) times daily as needed for anxiety. 90 capsule 0   traZODone (DESYREL) 50 MG tablet Take 1 tablet (50 mg total) by mouth at bedtime as needed for sleep. 50 tablet 2   No current facility-administered medications on file prior to visit.    No Known Allergies  Social History   Socioeconomic History   Marital status: Married    Spouse name: Not on file   Number of children: Not on file   Years of education: Not on file   Highest education level: Not on file  Occupational History   Not on file  Tobacco Use   Smoking status: Every Day    Packs/day: 0.50    Types: Cigarettes   Smokeless tobacco: Never  Vaping Use   Vaping Use: Never used  Substance and Sexual Activity   Alcohol use: No   Drug use: No   Sexual activity: Yes    Partners: Female    Comment: engaged 1 partner  Other Topics Concern   Not on file  Social History Narrative   Not on file   Social Determinants of Health   Financial Resource Strain: High Risk (08/06/2021)   Overall Financial Resource Strain (CARDIA)    Difficulty of  Paying Living Expenses: Hard  Food Insecurity: No Food Insecurity (08/06/2021)   Hunger Vital Sign    Worried About Running Out of Food in the Last Year: Never true    Ran Out of Food in the Last Year: Never true  Transportation Needs: Unmet Transportation Needs (08/06/2021)   PRAPARE - Transportation    Lack of Transportation (Medical): Yes    Lack of Transportation (Non-Medical): Yes  Physical Activity: Sufficiently Active (08/06/2021)   Exercise Vital Sign    Days of Exercise per Week: 7 days    Minutes of Exercise per Session: 30 min  Stress: Stress Concern Present (08/06/2021)   [employer] of Occupational Health - Occupational Stress Questionnaire    Feeling of Stress : Rather much  Social Connections: Socially Isolated (08/06/2021)   Social Connection and Isolation Panel [NHANES]    Frequency of Communication with Friends and Family: Twice a week    Frequency of Social Gatherings with Friends and Family: Never    Attends Religious Services: Never    Database administrator or Organizations: No    Attends Banker Meetings: Never    Marital Status: Living with partner  Intimate Partner Violence: Not At Risk (08/06/2021)   Humiliation, Afraid, Rape, and Kick questionnaire    Fear of  Current or Ex-Partner: No    Emotionally Abused: No    Physically Abused: No    Sexually Abused: No    No family history on file.  Past Surgical History:  Procedure Laterality Date   WRIST SURGERY     WRIST SURGERY      ROS: Review of Systems Negative except as stated above  PHYSICAL EXAM: There were no vitals taken for this visit.  Physical Exam  {male adult master:310786} {male adult master:310785}     Latest Ref Rng & Units 10/28/2021   11:48 PM 09/28/2016   12:41 PM 05/07/2013    9:19 PM  CMP  Glucose 70 - 99 mg/dL 95  88  450   BUN 6 - 20 mg/dL 11  11  12    Creatinine 0.61 - 1.24 mg/dL  3.88  8.28   Sodium 135 - 145 mmol/L 140  138  144   Potassium 3.5 -  5.1 mmol/L 3.5  4.0  3.6   Chloride 98 - 111 mmol/L 105  106  104   CO2 22 - 32 mmol/L 25  24  23    Calcium 8.9 - 10.3 mg/dL 9.7  9.4  9.6   Total Protein 6.5 - 8.1 g/dL  6.9  8.2   Total Bilirubin 0.3 - 1.2 mg/dL  0.9  0.2   Alkaline Phos 38 - 126 U/L  51  64   AST 15 - 41 U/L  26  41   ALT 17 - 63 U/L  18  56    Lipid Panel  No results found for: "CHOL", "TRIG", "HDL", "CHOLHDL", "VLDL", "LDLCALC", "LDLDIRECT"  CBC    Component Value Date/Time   WBC 11.5 (H) 10/28/2021 2348   RBC 5.01 10/28/2021 2348   HGB 15.4 10/28/2021 2348   HCT 44.0 10/28/2021 2348   PLT 355 10/28/2021 2348   MCV 87.8 10/28/2021 2348   MCH 30.7 10/28/2021 2348   MCHC 35.0 10/28/2021 2348   RDW 12.5 10/28/2021 2348   LYMPHSABS 3.5 05/07/2013 2119   MONOABS 0.6 05/07/2013 2119   EOSABS 0.1 05/07/2013 2119   BASOSABS 0.0 05/07/2013 2119    ASSESSMENT AND PLAN:  There are no diagnoses linked to this encounter.   Patient was given the opportunity to ask questions.  Patient verbalized understanding of the plan and was able to repeat key elements of the plan. Patient was given clear instructions to go to Emergency Department or return to medical center if symptoms don't improve, worsen, or new problems develop.The patient verbalized understanding.   No orders of the defined types were placed in this encounter.    Requested Prescriptions    No prescriptions requested or ordered in this encounter    No follow-ups on file.  07/05/2013, NP

## 2022-03-10 ENCOUNTER — Encounter: Payer: Medicaid Other | Admitting: Family

## 2022-04-21 ENCOUNTER — Encounter: Payer: Medicaid Other | Admitting: Family

## 2022-06-20 DIAGNOSIS — S39012A Strain of muscle, fascia and tendon of lower back, initial encounter: Secondary | ICD-10-CM | POA: Diagnosis not present

## 2022-06-20 DIAGNOSIS — M5134 Other intervertebral disc degeneration, thoracic region: Secondary | ICD-10-CM | POA: Diagnosis not present

## 2022-07-02 DIAGNOSIS — M5442 Lumbago with sciatica, left side: Secondary | ICD-10-CM | POA: Diagnosis not present

## 2022-11-04 NOTE — Telephone Encounter (Signed)
Open in error

## 2022-12-03 NOTE — Telephone Encounter (Signed)
See routing comment(s) for message information.

## 2022-12-29 ENCOUNTER — Encounter: Payer: Managed Care, Other (non HMO) | Admitting: Family

## 2022-12-29 NOTE — Progress Notes (Signed)
Erroneous encounter-disregard

## 2023-01-11 ENCOUNTER — Ambulatory Visit: Payer: Managed Care, Other (non HMO) | Admitting: Family

## 2023-01-11 DIAGNOSIS — K5900 Constipation, unspecified: Secondary | ICD-10-CM | POA: Diagnosis not present

## 2023-01-11 DIAGNOSIS — E669 Obesity, unspecified: Secondary | ICD-10-CM | POA: Diagnosis not present

## 2023-01-11 DIAGNOSIS — M51369 Other intervertebral disc degeneration, lumbar region without mention of lumbar back pain or lower extremity pain: Secondary | ICD-10-CM | POA: Diagnosis not present

## 2023-01-11 DIAGNOSIS — M5459 Other low back pain: Secondary | ICD-10-CM | POA: Diagnosis not present

## 2023-01-11 DIAGNOSIS — F17210 Nicotine dependence, cigarettes, uncomplicated: Secondary | ICD-10-CM | POA: Diagnosis not present

## 2023-01-11 DIAGNOSIS — G8929 Other chronic pain: Secondary | ICD-10-CM | POA: Diagnosis not present

## 2023-01-27 ENCOUNTER — Ambulatory Visit
Admission: EM | Admit: 2023-01-27 | Discharge: 2023-01-27 | Disposition: A | Discharge status: Home / Self Care | Payer: Managed Care, Other (non HMO) | Attending: Internal Medicine | Admitting: Internal Medicine

## 2023-01-27 DIAGNOSIS — F331 Major depressive disorder, recurrent, moderate: Secondary | ICD-10-CM

## 2023-01-27 DIAGNOSIS — F32A Depression, unspecified: Secondary | ICD-10-CM | POA: Diagnosis not present

## 2023-01-27 DIAGNOSIS — F419 Anxiety disorder, unspecified: Secondary | ICD-10-CM

## 2023-01-27 MED ORDER — HYDROXYZINE HCL 50 MG PO TABS: 50.0000 mg | ORAL_TABLET | Freq: Every evening | ORAL | 1 refills | Status: AC | PRN

## 2023-01-27 MED ORDER — FLUOXETINE HCL 20 MG PO CAPS: 20.0000 mg | ORAL_CAPSULE | Freq: Every day | ORAL | 1 refills | Status: AC

## 2023-01-27 NOTE — ED Triage Notes (Signed)
Pt needs Prozac 40 mg and hydroxyzine 50 mg refill.

## 2023-01-27 NOTE — Discharge Instructions (Signed)
Make sure you keep follow up with your behavioral therapist, mental health provider. Restart Prozac 20mg  once daily. You can use hydroxyzine 50mg  at bedtime as needed for insomnia and anxiety.

## 2023-01-27 NOTE — ED Provider Notes (Signed)
[facility]  Note:  This document was prepared using Conservation officer, historic buildings and may include unintentional dictation errors.  MRN: [id number] DOB: November 06, 1985  Subjective:   Genero Summersett is a 37 y.o. male presenting for medication refill of Prozac and hydroxyzine.  Patient has been out of it since July.  At the time, he was in jail and was increased from 20 mg to 40 mg.  Reports that he felt he did better.  He does have an appointment coming up with a behavioral health therapist.  Take Prozac for depression, use hydroxyzine as needed for anxiety.  No SI, HI.  No drug use.  No alcohol use.  No current facility-administered medications for this encounter.  Current Outpatient Medications:    FLUoxetine (PROZAC) 10 MG capsule, Take 1 capsule (10 mg total) by mouth daily., Disp: 30 capsule, Rfl: 2   FLUoxetine (PROZAC) 20 MG capsule, Take 1 capsule (20 mg total) by mouth daily., Disp: 30 capsule, Rfl: 0   hydrOXYzine (VISTARIL) 25 MG capsule, Take 1 capsule (25 mg total) by mouth 3 (three) times daily as needed for anxiety., Disp: 90 capsule, Rfl: 0   traZODone (DESYREL) 50 MG tablet, Take 1 tablet (50 mg total) by mouth at bedtime as needed for sleep., Disp: 50 tablet, Rfl: 2   No Known Allergies  Past Medical History:  Diagnosis Date   ADHD (attention deficit hyperactivity disorder)    Anxiety    Bipolar 1 disorder (HCC)    PTSD (post-traumatic stress disorder)      Past Surgical History:  Procedure Laterality Date   WRIST SURGERY     WRIST SURGERY      History reviewed. No pertinent family history.  Social History   Tobacco Use   Smoking status: Former    Current packs/day: 0.50    Types: Cigarettes   Smokeless tobacco: Never  Vaping Use   Vaping status: Never Used  Substance Use Topics   Alcohol use: No   Drug use: No    ROS   Objective:   Vitals: BP (!) 165/100 (BP Location: Right Arm)   Pulse 70   Temp 97.8 F (36.6  C) (Oral)   Resp 18   SpO2 98%   Physical Exam Constitutional:      General: He is not in acute distress.    Appearance: Normal appearance. He is well-developed and normal weight. He is not ill-appearing, toxic-appearing or diaphoretic.  HENT:     Head: Normocephalic and atraumatic.     Right Ear: External ear normal.     Left Ear: External ear normal.     Nose: Nose normal.     Mouth/Throat:     Pharynx: Oropharynx is clear.  Eyes:     General: No scleral icterus.       Right eye: No discharge.        Left eye: No discharge.     Extraocular Movements: Extraocular movements intact.  Cardiovascular:     Rate and Rhythm: Normal rate.  Pulmonary:     Effort: Pulmonary effort is normal.  Musculoskeletal:     Cervical back: Normal range of motion.  Neurological:     Mental Status: He is alert and oriented to person, place, and time.  Psychiatric:        Attention and Perception: Attention normal.        Mood and Affect: Mood normal. Mood is not anxious, depressed or elated. Affect is not labile,  blunt, flat, angry, tearful or inappropriate.        Speech: He is communicative. Speech is not rapid and pressured, delayed, slurred or tangential.        Behavior: Behavior normal. Behavior is not agitated, slowed, aggressive, withdrawn, hyperactive or combative.        Thought Content: Thought content normal. Thought content is not delusional. Thought content does not include homicidal or suicidal ideation.        Judgment: Judgment normal. Judgment is not impulsive or inappropriate.     Assessment and Plan :   PDMP not reviewed this encounter.  1. Anxiety and depression   2. Major depressive disorder, recurrent episode, moderate (HCC)    Patient is stable, recommended continued follow-up with his therapist.  Restart Prozac at 20 mg, refilled his hydroxyzine.  Counseled patient on potential for adverse effects with medications prescribed/recommended today, ER and return-to-clinic  precautions discussed, patient verbalized understanding.    Wallis Bamberg, PA-C 01/27/23 1104

## 2023-04-16 DIAGNOSIS — M79642 Pain in left hand: Secondary | ICD-10-CM | POA: Diagnosis not present

## 2023-04-16 DIAGNOSIS — G5602 Carpal tunnel syndrome, left upper limb: Secondary | ICD-10-CM | POA: Diagnosis not present

## 2023-05-13 DIAGNOSIS — Z1331 Encounter for screening for depression: Secondary | ICD-10-CM | POA: Diagnosis not present

## 2023-05-13 DIAGNOSIS — Z1339 Encounter for screening examination for other mental health and behavioral disorders: Secondary | ICD-10-CM | POA: Diagnosis not present

## 2023-05-13 DIAGNOSIS — F3160 Bipolar disorder, current episode mixed, unspecified: Secondary | ICD-10-CM | POA: Diagnosis not present

## 2023-05-13 DIAGNOSIS — F411 Generalized anxiety disorder: Secondary | ICD-10-CM | POA: Diagnosis not present

## 2023-05-13 DIAGNOSIS — G4700 Insomnia, unspecified: Secondary | ICD-10-CM | POA: Diagnosis not present

## 2023-05-13 DIAGNOSIS — Z6821 Body mass index (BMI) 21.0-21.9, adult: Secondary | ICD-10-CM | POA: Diagnosis not present

## 2023-06-01 DIAGNOSIS — G5603 Carpal tunnel syndrome, bilateral upper limbs: Secondary | ICD-10-CM | POA: Diagnosis not present

## 2023-06-06 DIAGNOSIS — Z6821 Body mass index (BMI) 21.0-21.9, adult: Secondary | ICD-10-CM | POA: Diagnosis not present

## 2023-06-06 DIAGNOSIS — F3160 Bipolar disorder, current episode mixed, unspecified: Secondary | ICD-10-CM | POA: Diagnosis not present

## 2023-06-06 DIAGNOSIS — G4700 Insomnia, unspecified: Secondary | ICD-10-CM | POA: Diagnosis not present

## 2023-06-06 DIAGNOSIS — F331 Major depressive disorder, recurrent, moderate: Secondary | ICD-10-CM | POA: Diagnosis not present

## 2023-06-12 DIAGNOSIS — S8265XA Nondisplaced fracture of lateral malleolus of left fibula, initial encounter for closed fracture: Secondary | ICD-10-CM | POA: Diagnosis not present

## 2023-06-12 DIAGNOSIS — F17210 Nicotine dependence, cigarettes, uncomplicated: Secondary | ICD-10-CM | POA: Diagnosis not present

## 2023-06-15 DIAGNOSIS — S82832A Other fracture of upper and lower end of left fibula, initial encounter for closed fracture: Secondary | ICD-10-CM | POA: Diagnosis not present

## 2023-06-18 DIAGNOSIS — G5603 Carpal tunnel syndrome, bilateral upper limbs: Secondary | ICD-10-CM | POA: Diagnosis not present

## 2023-06-18 DIAGNOSIS — S82492A Other fracture of shaft of left fibula, initial encounter for closed fracture: Secondary | ICD-10-CM | POA: Diagnosis not present

## 2023-06-23 DIAGNOSIS — E669 Obesity, unspecified: Secondary | ICD-10-CM | POA: Diagnosis not present

## 2023-06-23 DIAGNOSIS — G5602 Carpal tunnel syndrome, left upper limb: Secondary | ICD-10-CM | POA: Diagnosis not present

## 2023-06-23 DIAGNOSIS — M51369 Other intervertebral disc degeneration, lumbar region without mention of lumbar back pain or lower extremity pain: Secondary | ICD-10-CM | POA: Diagnosis not present

## 2023-06-23 DIAGNOSIS — F17290 Nicotine dependence, other tobacco product, uncomplicated: Secondary | ICD-10-CM | POA: Diagnosis not present

## 2023-06-23 DIAGNOSIS — G5603 Carpal tunnel syndrome, bilateral upper limbs: Secondary | ICD-10-CM | POA: Diagnosis not present

## 2023-06-23 DIAGNOSIS — Z79899 Other long term (current) drug therapy: Secondary | ICD-10-CM | POA: Diagnosis not present

## 2023-06-23 DIAGNOSIS — K219 Gastro-esophageal reflux disease without esophagitis: Secondary | ICD-10-CM | POA: Diagnosis not present

## 2023-07-07 DIAGNOSIS — S82832A Other fracture of upper and lower end of left fibula, initial encounter for closed fracture: Secondary | ICD-10-CM | POA: Diagnosis not present

## 2023-08-18 DIAGNOSIS — S82832K Other fracture of upper and lower end of left fibula, subsequent encounter for closed fracture with nonunion: Secondary | ICD-10-CM | POA: Diagnosis not present

## 2023-08-29 DIAGNOSIS — G4700 Insomnia, unspecified: Secondary | ICD-10-CM | POA: Diagnosis not present

## 2023-08-29 DIAGNOSIS — Z6835 Body mass index (BMI) 35.0-35.9, adult: Secondary | ICD-10-CM | POA: Diagnosis not present

## 2023-08-29 DIAGNOSIS — E6609 Other obesity due to excess calories: Secondary | ICD-10-CM | POA: Diagnosis not present

## 2023-08-29 DIAGNOSIS — Z79899 Other long term (current) drug therapy: Secondary | ICD-10-CM | POA: Diagnosis not present

## 2023-08-29 DIAGNOSIS — F331 Major depressive disorder, recurrent, moderate: Secondary | ICD-10-CM | POA: Diagnosis not present

## 2023-08-29 DIAGNOSIS — F3160 Bipolar disorder, current episode mixed, unspecified: Secondary | ICD-10-CM | POA: Diagnosis not present

## 2023-08-31 DIAGNOSIS — Z131 Encounter for screening for diabetes mellitus: Secondary | ICD-10-CM | POA: Diagnosis not present

## 2023-08-31 DIAGNOSIS — E66812 Obesity, class 2: Secondary | ICD-10-CM | POA: Diagnosis not present

## 2023-08-31 DIAGNOSIS — Z6835 Body mass index (BMI) 35.0-35.9, adult: Secondary | ICD-10-CM | POA: Diagnosis not present

## 2023-08-31 DIAGNOSIS — Z1159 Encounter for screening for other viral diseases: Secondary | ICD-10-CM | POA: Diagnosis not present

## 2023-08-31 DIAGNOSIS — Z114 Encounter for screening for human immunodeficiency virus [HIV]: Secondary | ICD-10-CM | POA: Diagnosis not present

## 2023-08-31 DIAGNOSIS — Z13220 Encounter for screening for lipoid disorders: Secondary | ICD-10-CM | POA: Diagnosis not present

## 2023-08-31 DIAGNOSIS — Z7189 Other specified counseling: Secondary | ICD-10-CM | POA: Diagnosis not present

## 2023-08-31 DIAGNOSIS — Z713 Dietary counseling and surveillance: Secondary | ICD-10-CM | POA: Diagnosis not present

## 2023-08-31 DIAGNOSIS — K219 Gastro-esophageal reflux disease without esophagitis: Secondary | ICD-10-CM | POA: Diagnosis not present

## 2023-08-31 DIAGNOSIS — E6609 Other obesity due to excess calories: Secondary | ICD-10-CM | POA: Diagnosis not present

## 2023-08-31 DIAGNOSIS — Z79899 Other long term (current) drug therapy: Secondary | ICD-10-CM | POA: Diagnosis not present
